# Patient Record
Sex: Male | Born: 1960 | Race: White | Hispanic: No | Marital: Married | Smoking: Former smoker
Health system: Southern US, Community
[De-identification: ages and names within clinical notes are randomized; demographics above are authoritative.]

## PROBLEM LIST (undated history)

## (undated) DIAGNOSIS — M1990 Unspecified osteoarthritis, unspecified site: Secondary | ICD-10-CM

## (undated) DIAGNOSIS — B019 Varicella without complication: Secondary | ICD-10-CM

## (undated) DIAGNOSIS — Z87442 Personal history of urinary calculi: Secondary | ICD-10-CM

## (undated) DIAGNOSIS — K5792 Diverticulitis of intestine, part unspecified, without perforation or abscess without bleeding: Secondary | ICD-10-CM

## (undated) DIAGNOSIS — E785 Hyperlipidemia, unspecified: Secondary | ICD-10-CM

## (undated) DIAGNOSIS — K219 Gastro-esophageal reflux disease without esophagitis: Secondary | ICD-10-CM

## (undated) DIAGNOSIS — I10 Essential (primary) hypertension: Secondary | ICD-10-CM

## (undated) DIAGNOSIS — E119 Type 2 diabetes mellitus without complications: Secondary | ICD-10-CM

## (undated) DIAGNOSIS — T884XXA Failed or difficult intubation, initial encounter: Secondary | ICD-10-CM

## (undated) HISTORY — DX: Varicella without complication: B01.9

## (undated) HISTORY — DX: Hyperlipidemia, unspecified: E78.5

## (undated) HISTORY — DX: Essential (primary) hypertension: I10

## (undated) HISTORY — DX: Diverticulitis of intestine, part unspecified, without perforation or abscess without bleeding: K57.92

## (undated) HISTORY — DX: Type 2 diabetes mellitus without complications: E11.9

## (undated) HISTORY — DX: Gastro-esophageal reflux disease without esophagitis: K21.9

---

## 1992-11-21 HISTORY — PX: KIDNEY STONE SURGERY: SHX686

## 2012-05-25 ENCOUNTER — Ambulatory Visit (INDEPENDENT_AMBULATORY_CARE_PROVIDER_SITE_OTHER): Payer: Medicare Other | Admitting: Internal Medicine

## 2012-05-25 ENCOUNTER — Encounter: Payer: Self-pay | Admitting: Internal Medicine

## 2012-05-25 VITALS — BP 130/80 | HR 64 | Temp 98.7°F | Ht 66.0 in | Wt 209.8 lb

## 2012-05-25 DIAGNOSIS — E119 Type 2 diabetes mellitus without complications: Secondary | ICD-10-CM

## 2012-05-25 DIAGNOSIS — R5383 Other fatigue: Secondary | ICD-10-CM

## 2012-05-25 DIAGNOSIS — E669 Obesity, unspecified: Secondary | ICD-10-CM

## 2012-05-25 DIAGNOSIS — I10 Essential (primary) hypertension: Secondary | ICD-10-CM

## 2012-05-25 DIAGNOSIS — E785 Hyperlipidemia, unspecified: Secondary | ICD-10-CM | POA: Insufficient documentation

## 2012-05-25 MED ORDER — METFORMIN HCL 500 MG PO TABS: 500.0000 mg | ORAL_TABLET | Freq: Two times a day (BID) | ORAL | Status: DC

## 2012-05-25 NOTE — Assessment & Plan Note (Signed)
Recent labs from pt employer show A1c of 6.7%, consistent with DM.  Will restart metformin. Encouraged reduction of intake of processed carbohydrates in the diet. Encouraged moderate physical activity with goal of most days of week. Gave pt glucometer today. Will have him check BG 1-2 times per week and email with readings. Follow up 2 months.

## 2012-05-25 NOTE — Assessment & Plan Note (Signed)
Will check LFTs and labs prior to visit in 2 months. Pt was recently started on Simvastatin by previous PCP.

## 2012-05-25 NOTE — Assessment & Plan Note (Signed)
BMI 33. Encouraged improved diet with reduction in processed carbohydrates and increased physical activity such as walking on treadmill at work, with goal of per week.

## 2012-05-25 NOTE — Assessment & Plan Note (Signed)
BP well controlled today. Will continue current medications. Will check renal function with labs prior to next visit 2 months.

## 2012-05-25 NOTE — Progress Notes (Signed)
Subjective:    Patient ID: Caleb Warren, male    DOB: Sep 05, 1961, 51 y.o.   MRN: [id number]  HPI 51YO male with h/o DM, HTN, HL presents to establish care. His primary concern today is fatigue. 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 He typically gets 5-6 hours of sleep per night.  He has not made any recent changes in diet or exercise routine.  He has a h/o DM, but has recently been off metformin because blood sugars had been well controlled without medication.  He does not check his BG.  He reports full compliance with other medications. He denies focal symptoms such as chest pain, headache, fever, change in bowel habits, dyspnea.  Outpatient Encounter Prescriptions as of 05/25/2012  Medication Sig Dispense Refill  . aspirin 81 MG tablet Take 81 mg by mouth daily.      . Cholecalciferol (VITAMIN D3 SUPER STRENGTH) 2000 UNITS TABS Take 1 tablet by mouth daily.      . ciprofloxacin (CIPRO) 500 MG tablet Take 500 mg by mouth 2 (two) times daily.      Marland Kitchen lisinopril (PRINIVIL,ZESTRIL) 20 MG tablet Take 20 mg by mouth daily.      . metroNIDAZOLE (FLAGYL) 250 MG tablet Take 250 mg by mouth as needed.      Marland Kitchen omeprazole (PRILOSEC) 20 MG capsule Take 20 mg by mouth as needed.      . simvastatin (ZOCOR) 20 MG tablet Take 20 mg by mouth at bedtime.      . metFORMIN (GLUCOPHAGE) 500 MG tablet Take 1 tablet (500 mg total) by mouth 2 (two) times daily with a meal.  180 tablet  3    Review of Systems  Constitutional: Positive for fatigue. Negative for fever, chills, activity change, appetite change and unexpected weight change.  Eyes: Negative for visual disturbance.  Respiratory: Negative for cough and shortness of breath.   Cardiovascular: Negative for chest pain, palpitations and leg swelling.  Gastrointestinal: Negative for abdominal pain and abdominal distention.  Genitourinary: Negative for dysuria, urgency and difficulty urinating.  Musculoskeletal:  Negative for arthralgias and gait problem.  Skin: Negative for color change and rash.  Hematological: Negative for adenopathy.  Psychiatric/Behavioral: Negative for disturbed wake/sleep cycle and dysphoric mood. The patient is not nervous/anxious.    BP 130/80  Pulse 64  Temp 98.7 F (37.1 C) (Oral)  Ht 5\' 6"  (1.676 m)  Wt 209 lb 12 oz (95.142 kg)  BMI 33.85 kg/m2  SpO2 98%      Objective:   Physical Exam  Constitutional: He is oriented to person, place, and time. He appears well-developed and well-nourished. No distress.  HENT:  Head: Normocephalic and atraumatic.  Right Ear: External ear normal.  Left Ear: External ear normal.  Nose: Nose normal.  Mouth/Throat: Oropharynx is clear and moist. No oropharyngeal exudate.  Eyes: Conjunctivae and EOM are normal. Pupils are equal, round, and reactive to light. Right eye exhibits no discharge. Left eye exhibits no discharge. No scleral icterus.  Neck: Normal range of motion. Neck supple. No tracheal deviation present. No thyromegaly present.  Cardiovascular: Normal rate, regular rhythm and normal heart sounds.  Exam reveals no gallop and no friction rub.   No murmur heard. Pulmonary/Chest: Effort normal and breath sounds normal. No respiratory distress. He has no wheezes. He has no rales. He exhibits no tenderness.  Abdominal: Soft. Bowel sounds are normal. He exhibits no distension and no mass. There is no tenderness.  There is no guarding.  Musculoskeletal: Normal range of motion. He exhibits no edema.  Lymphadenopathy:    He has no cervical adenopathy.  Neurological: He is alert and oriented to person, place, and time. No cranial nerve deficit. Coordination normal.  Skin: Skin is warm and dry. No rash noted. He is not diaphoretic. No erythema. No pallor.  Psychiatric: He has a normal mood and affect. His behavior is normal. Judgment and thought content normal.          Assessment & Plan:

## 2012-05-25 NOTE — Assessment & Plan Note (Signed)
Likely related to shift-work schedule and sleep deprivation. Will check CBC, TSH, CMP, B12, and testosterone level with labs. If labs normal, will also consider sleep study. Follow up 2 months.

## 2012-05-31 ENCOUNTER — Telehealth: Payer: Self-pay | Admitting: Internal Medicine

## 2012-05-31 NOTE — Telephone Encounter (Signed)
Advised patient as instructed via message left on cell phone voicemail. 

## 2012-05-31 NOTE — Telephone Encounter (Signed)
Labs from outside show normal blood counts, normal kidney and liver function, normal B12 level, hemoglobin A1c of 7.1%, slightly low testosterone level, normal thyroid function, and normal urine protein. We can discuss at follow up.

## 2012-07-12 ENCOUNTER — Ambulatory Visit (INDEPENDENT_AMBULATORY_CARE_PROVIDER_SITE_OTHER): Payer: No Typology Code available for payment source | Admitting: Internal Medicine

## 2012-07-12 ENCOUNTER — Encounter: Payer: Self-pay | Admitting: Internal Medicine

## 2012-07-12 VITALS — BP 140/90 | HR 60 | Temp 98.3°F | Wt 212.8 lb

## 2012-07-12 DIAGNOSIS — E669 Obesity, unspecified: Secondary | ICD-10-CM

## 2012-07-12 DIAGNOSIS — R5383 Other fatigue: Secondary | ICD-10-CM

## 2012-07-12 DIAGNOSIS — R7989 Other specified abnormal findings of blood chemistry: Secondary | ICD-10-CM | POA: Insufficient documentation

## 2012-07-12 DIAGNOSIS — E785 Hyperlipidemia, unspecified: Secondary | ICD-10-CM

## 2012-07-12 DIAGNOSIS — E291 Testicular hypofunction: Secondary | ICD-10-CM

## 2012-07-12 DIAGNOSIS — I10 Essential (primary) hypertension: Secondary | ICD-10-CM

## 2012-07-12 DIAGNOSIS — E119 Type 2 diabetes mellitus without complications: Secondary | ICD-10-CM

## 2012-07-12 DIAGNOSIS — L989 Disorder of the skin and subcutaneous tissue, unspecified: Secondary | ICD-10-CM

## 2012-07-12 DIAGNOSIS — R5381 Other malaise: Secondary | ICD-10-CM

## 2012-07-12 NOTE — Assessment & Plan Note (Signed)
Blood sugars have been well-controlled. We'll continue metformin. Plan to repeat A1c in October 2013.

## 2012-07-12 NOTE — Assessment & Plan Note (Signed)
Skin lesion of the left temple is most consistent with seborrheic keratosis. Discussed referral to dermatology. He would like to discuss with his girlfriend about 3 would prefer to see. He will call our office with doctor's name.

## 2012-07-12 NOTE — Assessment & Plan Note (Signed)
Lipids well-controlled. Will repeat lipids in October 2013. Continue simvastatin.

## 2012-07-12 NOTE — Assessment & Plan Note (Addendum)
Blood pressure slightly elevated today. Patient will check blood pressure at home 1 or 2 times per week and call or e-mail with results. Will repeat renal function with labs in October 2013. Continue lisinopril.

## 2012-07-12 NOTE — Patient Instructions (Signed)
[facility] 

## 2012-07-12 NOTE — Assessment & Plan Note (Signed)
Patient is starting new exercise and diet program. Encouraged him to limit intake of processed carbohydrates and increasing intake of being protein and vegetables. We also discussed eating small meals 6 times per day. Encouraged him to proceed with exercise program and set goal of aerobic exercise 175 minutes per week.

## 2012-07-12 NOTE — Assessment & Plan Note (Signed)
Patient with some persistent symptoms of fatigue. Questions if this may be related to low testosterone. We discussed potentially replacing testosterone. He would like to hold off and try starting new nutrition and exercise regimen first. If symptoms of fatigue persist, will plan to start testosterone supplementation at next visit in October 2013.

## 2012-07-12 NOTE — Progress Notes (Signed)
Subjective:    Patient ID: Caleb Warren, male    DOB: 1960-12-02, 51 y.o.   MRN: [id number]  HPI 51 year old male with history of diabetes, hypertension, hyperlipidemia presents for followup. He reports he is generally feeling well. In regards to his blood sugars, he reports blood sugar has been well-controlled, typically 110s to 120s. He reports full compliance with metformin. He denies any elevated blood sugars greater than 250. He reports he is focusing on improving his diet and limiting carbohydrates. He plans to adopt a diet of small meals, eating 6 times per day filled with clean protein and vegetables. He is also planning to start an exercise program with increased cardio.  In regards to hypertension, he reports he has not been regularly checking his blood pressure at home but has not had any headache, palpitations, or chest pain. He reports full compliance with his medication.  He continues to have some mild fatigue. He questions if this may be secondary to low testosterone. He is interested in looking into testosterone supplementation.  Outpatient Encounter Prescriptions as of 07/12/2012  Medication Sig Dispense Refill  . aspirin 81 MG tablet Take 81 mg by mouth daily.      . Cholecalciferol (VITAMIN D3 SUPER STRENGTH) 2000 UNITS TABS Take 1 tablet by mouth daily.      Marland Kitchen lisinopril (PRINIVIL,ZESTRIL) 20 MG tablet Take 20 mg by mouth daily.      . metFORMIN (GLUCOPHAGE) 500 MG tablet Take 1 tablet (500 mg total) by mouth 2 (two) times daily with a meal.  180 tablet  3  . metroNIDAZOLE (FLAGYL) 250 MG tablet Take 250 mg by mouth as needed.      Marland Kitchen omeprazole (PRILOSEC) 20 MG capsule Take 20 mg by mouth as needed.      . simvastatin (ZOCOR) 20 MG tablet Take 20 mg by mouth at bedtime.      Marland Kitchen DISCONTD: ciprofloxacin (CIPRO) 500 MG tablet Take 500 mg by mouth 2 (two) times daily.        Review of Systems  Constitutional: Positive for fatigue. Negative for fever, chills, activity change,  appetite change and unexpected weight change.  Eyes: Negative for visual disturbance.  Respiratory: Negative for cough and shortness of breath.   Cardiovascular: Negative for chest pain, palpitations and leg swelling.  Gastrointestinal: Negative for abdominal pain and abdominal distention.  Genitourinary: Negative for dysuria, urgency and difficulty urinating.  Musculoskeletal: Negative for arthralgias and gait problem.  Skin: Negative for color change and rash.  Hematological: Negative for adenopathy.  Psychiatric/Behavioral: Negative for disturbed wake/sleep cycle and dysphoric mood. The patient is not nervous/anxious.    BP 140/90  Pulse 60  Temp 98.3 F (36.8 C) (Oral)  Wt 212 lb 12 oz (96.503 kg)  SpO2 98%     Objective:   Physical Exam  Constitutional: He is oriented to person, place, and time. He appears well-developed and well-nourished. No distress.  HENT:  Head: Normocephalic and atraumatic.  Right Ear: External ear normal.  Left Ear: External ear normal.  Nose: Nose normal.  Mouth/Throat: Oropharynx is clear and moist. No oropharyngeal exudate.  Eyes: Conjunctivae and EOM are normal. Pupils are equal, round, and reactive to light. Right eye exhibits no discharge. Left eye exhibits no discharge. No scleral icterus.  Neck: Normal range of motion. Neck supple. No tracheal deviation present. No thyromegaly present.  Cardiovascular: Normal rate, regular rhythm and normal heart sounds.  Exam reveals no gallop and no friction rub.   No murmur heard.  Pulmonary/Chest: Effort normal and breath sounds normal. No respiratory distress. He has no wheezes. He has no rales. He exhibits no tenderness.  Musculoskeletal: Normal range of motion. He exhibits no edema.  Lymphadenopathy:    He has no cervical adenopathy.  Neurological: He is alert and oriented to person, place, and time. No cranial nerve deficit. Coordination normal.  Skin: Skin is warm and dry. No rash noted. He is not  diaphoretic. No erythema. No pallor.  Psychiatric: He has a normal mood and affect. His behavior is normal. Judgment and thought content normal.          Assessment & Plan:

## 2012-07-12 NOTE — Assessment & Plan Note (Signed)
Total testosterone was slightly low on labs from July. We discussed checking free and total testosterone. We also discussed potential risk and benefits of testosterone supplementation. He would prefer to hold off for now. Will repeat testosterone level in October 2013.

## 2012-10-22 ENCOUNTER — Ambulatory Visit: Payer: No Typology Code available for payment source | Admitting: Internal Medicine

## 2012-10-23 ENCOUNTER — Ambulatory Visit (INDEPENDENT_AMBULATORY_CARE_PROVIDER_SITE_OTHER): Payer: No Typology Code available for payment source | Admitting: Internal Medicine

## 2012-10-23 ENCOUNTER — Encounter: Payer: Self-pay | Admitting: Internal Medicine

## 2012-10-23 VITALS — BP 132/80 | HR 82 | Temp 97.8°F | Resp 16 | Wt 208.5 lb

## 2012-10-23 DIAGNOSIS — I10 Essential (primary) hypertension: Secondary | ICD-10-CM

## 2012-10-23 DIAGNOSIS — E669 Obesity, unspecified: Secondary | ICD-10-CM

## 2012-10-23 DIAGNOSIS — E119 Type 2 diabetes mellitus without complications: Secondary | ICD-10-CM

## 2012-10-23 DIAGNOSIS — Z23 Encounter for immunization: Secondary | ICD-10-CM

## 2012-10-23 DIAGNOSIS — E785 Hyperlipidemia, unspecified: Secondary | ICD-10-CM

## 2012-10-23 MED ORDER — PNEUMOCOCCAL VAC POLYVALENT 25 MCG/0.5ML IJ INJ: 0.5000 mL | INJECTION | Freq: Once | INTRAMUSCULAR | Status: DC

## 2012-10-23 NOTE — Assessment & Plan Note (Signed)
BG well controlled based on pt report. Will check A1c with labs today. Continue metformin. Follow up 3 months and prn.

## 2012-10-23 NOTE — Assessment & Plan Note (Signed)
BP well controlled on current meds. Will continue. Will check renal function with labs. Follow up 3 months and prn.

## 2012-10-23 NOTE — Assessment & Plan Note (Signed)
Will check lipids with labs. Continue Simvastatin. Follow up 3 months.

## 2012-10-23 NOTE — Assessment & Plan Note (Signed)
BMI 33. Encouraged increasing intake of lean protein and decreased intake of processed carbohydrates. Encouraged goal of 30-95min physical exercise 5 days per week.

## 2012-10-23 NOTE — Progress Notes (Signed)
Subjective:    Patient ID: Caleb Warren, male    DOB: March 01, 1961, 51 y.o.   MRN: [id number]  HPI 51YO male with h/o DM, HTN, HL presents for follow up. Doing well. BG typically near 100-120 fasting.  Compliant with metformin. In regards to BP, denies any chest pain, palpitations, headache. Compliant with lisinopril. No new concerns today. Normal energy level. Has recently been traveling and spending time snorkeling.  Outpatient Encounter Prescriptions as of 10/23/2012  Medication Sig Dispense Refill  . aspirin 81 MG tablet Take 81 mg by mouth daily.      . Cholecalciferol (VITAMIN D3 SUPER STRENGTH) 2000 UNITS TABS Take 1 tablet by mouth daily.      Marland Kitchen lisinopril (PRINIVIL,ZESTRIL) 20 MG tablet Take 20 mg by mouth daily.      . metFORMIN (GLUCOPHAGE) 500 MG tablet Take 1 tablet (500 mg total) by mouth 2 (two) times daily with a meal.  180 tablet  3  . metroNIDAZOLE (FLAGYL) 250 MG tablet Take 250 mg by mouth as needed.      Marland Kitchen omeprazole (PRILOSEC) 20 MG capsule Take 20 mg by mouth as needed.      . simvastatin (ZOCOR) 20 MG tablet Take 20 mg by mouth at bedtime.       Facility-Administered Encounter Medications as of 10/23/2012  Medication Dose Route Frequency Provider Last Rate Last Dose  . pneumococcal 23 valent vaccine (PNU-IMMUNE) injection 0.5 mL  0.5 mL Intramuscular Once Shelia Media, MD       BP 132/80  Pulse 82  Temp 97.8 F (36.6 C) (Oral)  Resp 16  Wt 208 lb 8 oz (94.575 kg)  Review of Systems  Constitutional: Negative for fever, chills, activity change, appetite change, fatigue and unexpected weight change.  Eyes: Negative for visual disturbance.  Respiratory: Negative for cough and shortness of breath.   Cardiovascular: Negative for chest pain, palpitations and leg swelling.  Gastrointestinal: Negative for abdominal pain and abdominal distention.  Genitourinary: Negative for dysuria, urgency and difficulty urinating.  Musculoskeletal: Negative for arthralgias and gait  problem.  Skin: Negative for color change and rash.  Hematological: Negative for adenopathy.  Psychiatric/Behavioral: Negative for sleep disturbance and dysphoric mood. The patient is not nervous/anxious.        Objective:   Physical Exam  Constitutional: He is oriented to person, place, and time. He appears well-developed and well-nourished. No distress.  HENT:  Head: Normocephalic and atraumatic.  Right Ear: External ear normal.  Left Ear: External ear normal.  Nose: Nose normal.  Mouth/Throat: Oropharynx is clear and moist. No oropharyngeal exudate.  Eyes: Conjunctivae normal and EOM are normal. Pupils are equal, round, and reactive to light. Right eye exhibits no discharge. Left eye exhibits no discharge. No scleral icterus.  Neck: Normal range of motion. Neck supple. No tracheal deviation present. No thyromegaly present.  Cardiovascular: Normal rate, regular rhythm and normal heart sounds.  Exam reveals no gallop and no friction rub.   No murmur heard. Pulmonary/Chest: Effort normal and breath sounds normal. No respiratory distress. He has no wheezes. He has no rales. He exhibits no tenderness.  Musculoskeletal: Normal range of motion. He exhibits no edema.  Lymphadenopathy:    He has no cervical adenopathy.  Neurological: He is alert and oriented to person, place, and time. No cranial nerve deficit. Coordination normal.  Skin: Skin is warm and dry. No rash noted. He is not diaphoretic. No erythema. No pallor.  Psychiatric: He has a normal mood and affect.  His behavior is normal. Judgment and thought content normal.          Assessment & Plan:

## 2013-02-05 ENCOUNTER — Ambulatory Visit: Payer: No Typology Code available for payment source | Admitting: Internal Medicine

## 2013-03-07 ENCOUNTER — Ambulatory Visit (INDEPENDENT_AMBULATORY_CARE_PROVIDER_SITE_OTHER): Payer: No Typology Code available for payment source | Admitting: Internal Medicine

## 2013-03-07 ENCOUNTER — Encounter: Payer: Self-pay | Admitting: Internal Medicine

## 2013-03-07 VITALS — BP 120/90 | HR 84 | Temp 97.9°F | Wt 209.0 lb

## 2013-03-07 DIAGNOSIS — G4726 Circadian rhythm sleep disorder, shift work type: Secondary | ICD-10-CM

## 2013-03-07 DIAGNOSIS — R5381 Other malaise: Secondary | ICD-10-CM

## 2013-03-07 DIAGNOSIS — E119 Type 2 diabetes mellitus without complications: Secondary | ICD-10-CM

## 2013-03-07 DIAGNOSIS — E785 Hyperlipidemia, unspecified: Secondary | ICD-10-CM

## 2013-03-07 DIAGNOSIS — I10 Essential (primary) hypertension: Secondary | ICD-10-CM

## 2013-03-07 MED ORDER — ARMODAFINIL 150 MG PO TABS: 150.0000 mg | ORAL_TABLET | Freq: Every day | ORAL | Status: DC

## 2013-03-07 NOTE — Assessment & Plan Note (Signed)
Patient with persistent generalized fatigue. Labs are unremarkable in the past except for mildly low testosterone. Typically sleeping 4 to 6 hours per night. Has never been evaluated for sleep apnea. Will set up sleep study. Suspect that shift work is playing a significant role and ongoing fatigue. 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

## 2013-03-07 NOTE — Assessment & Plan Note (Signed)
Per patient report, blood sugars have been well-controlled at home. Will check A1c with labs. Continue current medications.

## 2013-03-07 NOTE — Assessment & Plan Note (Signed)
BP Readings from Last 3 Encounters:  03/07/13 120/90  10/23/12 132/80  07/12/12 140/90   Blood pressures well-controlled on current medications. Will check renal function with labs.

## 2013-03-07 NOTE — Progress Notes (Signed)
Subjective:    Patient ID: Caleb Warren, male    DOB: 1961-08-05, 52 y.o.   MRN: [id number]  HPI 52 year old male with history of diabetes, hypertension, hyperlipidemia presents for followup. He reports he is generally doing well. He reports blood sugars have been well-controlled. He is compliant with his medications. He continues to have ongoing significant fatigue. He reports that when at work, he can fall asleep after sitting still for only a few minutes. 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 He finds it difficult to transition. He is unsure he snores at night. He typically sleeps for about 4-6 hours each night. He has never been evaluated for sleep apnea. He denies any focal symptoms of shortness of breath or chest pain. He denies change in bowel habits or appetite.  Outpatient Encounter Prescriptions as of 03/07/2013  Medication Sig Dispense Refill  . aspirin 81 MG tablet Take 81 mg by mouth daily.      . Cholecalciferol (VITAMIN D3 SUPER STRENGTH) 2000 UNITS TABS Take 1 tablet by mouth daily.      Marland Kitchen lisinopril (PRINIVIL,ZESTRIL) 20 MG tablet Take 20 mg by mouth daily.      . metFORMIN (GLUCOPHAGE) 500 MG tablet Take 1 tablet (500 mg total) by mouth 2 (two) times daily with a meal.  180 tablet  3  . metroNIDAZOLE (FLAGYL) 250 MG tablet Take 250 mg by mouth as needed.      Marland Kitchen omeprazole (PRILOSEC) 20 MG capsule Take 20 mg by mouth as needed.      . simvastatin (ZOCOR) 20 MG tablet Take 20 mg by mouth at bedtime.      . Armodafinil 150 MG tablet Take 1 tablet (150 mg total) by mouth daily.  30 tablet  3  . [DISCONTINUED] pneumococcal 23 valent vaccine (PNU-IMMUNE) injection 0.5 mL        No facility-administered encounter medications on file as of 03/07/2013.   BP 120/90  Pulse 84  Temp(Src) 97.9 F (36.6 C) (Oral)  Wt 209 lb (94.802 kg)  BMI 33.75 kg/m2  SpO2 95%    Review of Systems  Constitutional: Positive for fatigue. Negative for  fever, chills, activity change, appetite change and unexpected weight change.  Eyes: Negative for visual disturbance.  Respiratory: Negative for cough and shortness of breath.   Cardiovascular: Negative for chest pain, palpitations and leg swelling.  Gastrointestinal: Negative for abdominal pain and abdominal distention.  Genitourinary: Negative for dysuria, urgency and difficulty urinating.  Musculoskeletal: Negative for arthralgias and gait problem.  Skin: Negative for color change and rash.  Hematological: Negative for adenopathy.  Psychiatric/Behavioral: Negative for sleep disturbance and dysphoric mood. The patient is not nervous/anxious.        Objective:   Physical Exam  Constitutional: He is oriented to person, place, and time. He appears well-developed and well-nourished. No distress.  HENT:  Head: Normocephalic and atraumatic.  Right Ear: External ear normal.  Left Ear: External ear normal.  Nose: Nose normal.  Mouth/Throat: Oropharynx is clear and moist. No oropharyngeal exudate.  Eyes: Conjunctivae and EOM are normal. Pupils are equal, round, and reactive to light. Right eye exhibits no discharge. Left eye exhibits no discharge. No scleral icterus.  Neck: Normal range of motion. Neck supple. No tracheal deviation present. No thyromegaly present.  Cardiovascular: Normal rate, regular rhythm and normal heart sounds.  Exam reveals no gallop and no friction rub.   No murmur heard. Pulmonary/Chest: Breath sounds  normal. No accessory muscle usage. Not tachypneic. No respiratory distress. He has no decreased breath sounds. He has no wheezes. He has no rales. He exhibits no tenderness.  Musculoskeletal: Normal range of motion. He exhibits no edema.  Lymphadenopathy:    He has no cervical adenopathy.  Neurological: He is alert and oriented to person, place, and time. No cranial nerve deficit. Coordination normal.  Skin: Skin is warm and dry. No rash noted. He is not diaphoretic. No  erythema. No pallor.  Psychiatric: He has a normal mood and affect. His behavior is normal. Judgment and thought content normal.          Assessment & Plan:

## 2013-03-07 NOTE — Assessment & Plan Note (Signed)
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 Will start Nuvigil. He will take medication 1 hour prior to start of his work shift. Pt will follow up in 4 weeks and prn.

## 2013-03-07 NOTE — Assessment & Plan Note (Signed)
Will check lipids and LFTs with labs. 

## 2013-03-20 ENCOUNTER — Telehealth: Payer: Self-pay | Admitting: Internal Medicine

## 2013-03-20 NOTE — Telephone Encounter (Signed)
Labs reviewed from 4/25 look good. Triglycerides slightly high, 301. Would recommend effort at limiting processed carbohydrate intake and increasing physical activity with goal 3x per week.

## 2013-03-22 ENCOUNTER — Telehealth: Payer: Self-pay

## 2013-03-22 NOTE — Telephone Encounter (Signed)
MyChart Message: Labs reviewed from 4/25 look good. Triglycerides slightly high, 301. Would recommend effort at limiting processed carbohydrate intake and increasing physical activity with goal 3x per week.   Notified patient of his lab results.

## 2013-03-23 ENCOUNTER — Encounter: Payer: Self-pay | Admitting: Internal Medicine

## 2013-04-03 ENCOUNTER — Ambulatory Visit: Payer: Self-pay | Admitting: Internal Medicine

## 2013-04-05 ENCOUNTER — Ambulatory Visit: Payer: No Typology Code available for payment source | Admitting: Internal Medicine

## 2013-04-23 ENCOUNTER — Encounter: Payer: Self-pay | Admitting: Internal Medicine

## 2013-04-23 ENCOUNTER — Ambulatory Visit (INDEPENDENT_AMBULATORY_CARE_PROVIDER_SITE_OTHER): Payer: No Typology Code available for payment source | Admitting: Internal Medicine

## 2013-04-23 VITALS — BP 120/90 | HR 73 | Temp 98.2°F | Wt 204.0 lb

## 2013-04-23 DIAGNOSIS — G4726 Circadian rhythm sleep disorder, shift work type: Secondary | ICD-10-CM

## 2013-04-23 MED ORDER — ARMODAFINIL 250 MG PO TABS: 250.0000 mg | ORAL_TABLET | Freq: Every day | ORAL | Status: DC

## 2013-04-23 NOTE — Assessment & Plan Note (Signed)
Symptoms improved with Nuvigil, however effects are short-lived and do not last through shift. Will try increasing dose to 250mg . Follow up 8 weeks and prn.

## 2013-04-23 NOTE — Progress Notes (Signed)
Subjective:    Patient ID: Caleb Warren, male    DOB: 1961/04/28, 52 y.o.   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 Caleb Warren reports that symptoms are much improved on this medication. Caleb Warren denies any side effects from the medicine. However, fracture of the medicine seemed to be short lived, only a couple of hours. Caleb Warren questions whether higher dose might be helpful. Caleb Warren has not had any difficulty sleeping well using this medication.  Outpatient Encounter Prescriptions as of 04/23/2013  Medication Sig Dispense Refill  . aspirin 81 MG tablet Take 81 mg by mouth daily.      . Cholecalciferol (VITAMIN D3 SUPER STRENGTH) 2000 UNITS TABS Take 1 tablet by mouth daily.      Marland Kitchen lisinopril (PRINIVIL,ZESTRIL) 20 MG tablet Take 20 mg by mouth daily.      . Magnesium 100 MG CAPS Take by mouth.      . metFORMIN (GLUCOPHAGE) 500 MG tablet Take 1 tablet (500 mg total) by mouth 2 (two) times daily with a meal.  180 tablet  3  . omeprazole (PRILOSEC) 20 MG capsule Take 20 mg by mouth as needed.      . simvastatin (ZOCOR) 20 MG tablet Take 20 mg by mouth at bedtime.      . [DISCONTINUED] Armodafinil 150 MG tablet Take 1 tablet (150 mg total) by mouth daily.  30 tablet  3  . Armodafinil 250 MG tablet Take 1 tablet (250 mg total) by mouth daily.  30 tablet  3  . metroNIDAZOLE (FLAGYL) 250 MG tablet Take 250 mg by mouth as needed.       No facility-administered encounter medications on file as of 04/23/2013.   BP 120/90  Pulse 73  Temp(Src) 98.2 F (36.8 C) (Oral)  Wt 204 lb (92.534 kg)  BMI 32.94 kg/m2  SpO2 95%  Review of Systems  Constitutional: Negative for fever, chills, activity change, appetite change, fatigue and unexpected weight change.  Eyes: Negative for visual disturbance.  Respiratory: Negative for cough and shortness of breath.   Cardiovascular: Negative for chest pain, palpitations and leg swelling.  Gastrointestinal: Negative for  abdominal pain and abdominal distention.  Genitourinary: Negative for dysuria, urgency and difficulty urinating.  Musculoskeletal: Negative for arthralgias and gait problem.  Skin: Negative for color change and rash.  Hematological: Negative for adenopathy.  Psychiatric/Behavioral: Negative for sleep disturbance and dysphoric mood. The patient is not nervous/anxious.        Objective:   Physical Exam  Constitutional: Caleb Warren is oriented to person, place, and time. Caleb Warren appears well-developed and well-nourished. No distress.  HENT:  Head: Normocephalic and atraumatic.  Right Ear: External ear normal.  Left Ear: External ear normal.  Nose: Nose normal.  Mouth/Throat: Oropharynx is clear and moist. No oropharyngeal exudate.  Eyes: Conjunctivae and EOM are normal. Pupils are equal, round, and reactive to light. Right eye exhibits no discharge. Left eye exhibits no discharge. No scleral icterus.  Neck: Normal range of motion. Neck supple. No tracheal deviation present. No thyromegaly present.  Cardiovascular: Normal rate, regular rhythm and normal heart sounds.  Exam reveals no gallop and no friction rub.   No murmur heard. Pulmonary/Chest: Effort normal and breath sounds normal. No respiratory distress. Caleb Warren has no wheezes. Caleb Warren has no rales. Caleb Warren exhibits no tenderness.  Musculoskeletal: Normal range of motion. Caleb Warren exhibits no edema.  Lymphadenopathy:    Caleb Warren has no cervical adenopathy.  Neurological: Caleb Warren is alert  and oriented to person, place, and time. No cranial nerve deficit. Coordination normal.  Skin: Skin is warm and dry. No rash noted. Caleb Warren is not diaphoretic. No erythema. No pallor.  Psychiatric: Caleb Warren has a normal mood and affect. His behavior is normal. Judgment and thought content normal.          Assessment & Plan:

## 2013-05-22 ENCOUNTER — Telehealth: Payer: Self-pay | Admitting: Internal Medicine

## 2013-05-22 NOTE — Telephone Encounter (Signed)
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 They recommend CPAP titration study. Has this been scheduled?

## 2013-05-23 NOTE — Telephone Encounter (Signed)
Left message to call back  

## 2013-05-27 ENCOUNTER — Other Ambulatory Visit: Payer: Self-pay | Admitting: Internal Medicine

## 2013-05-27 NOTE — Telephone Encounter (Signed)
Left message to call back  

## 2013-05-28 NOTE — Telephone Encounter (Signed)
Patient never returned call, letter mailed to home address on file.  

## 2013-05-29 ENCOUNTER — Telehealth: Payer: Self-pay | Admitting: Internal Medicine

## 2013-05-29 NOTE — Telephone Encounter (Signed)
FYI to Dr. Walker 

## 2013-05-29 NOTE — Telephone Encounter (Signed)
Hold off on the CPAP titration . The patient stated he is still being billed from the sleep study and he wants to wait.

## 2013-06-18 ENCOUNTER — Encounter: Payer: Self-pay | Admitting: Internal Medicine

## 2013-06-19 ENCOUNTER — Encounter: Payer: Self-pay | Admitting: Internal Medicine

## 2013-06-20 ENCOUNTER — Encounter: Payer: Self-pay | Admitting: Internal Medicine

## 2013-06-20 MED ORDER — MODAFINIL 200 MG PO TABS: 200.0000 mg | ORAL_TABLET | Freq: Every day | ORAL | Status: DC

## 2013-06-21 ENCOUNTER — Ambulatory Visit: Payer: No Typology Code available for payment source | Admitting: Internal Medicine

## 2013-07-12 ENCOUNTER — Telehealth: Payer: Self-pay | Admitting: Internal Medicine

## 2013-07-12 NOTE — Telephone Encounter (Signed)
Labs were relatively stable. A1c was 6.5%. We can review labs in detail at next visit.

## 2013-07-12 NOTE — Telephone Encounter (Signed)
Patient informed and verbally agreed.  

## 2013-07-20 LAB — HM DIABETES EYE EXAM

## 2013-07-23 ENCOUNTER — Encounter: Payer: Self-pay | Admitting: *Deleted

## 2013-07-24 ENCOUNTER — Ambulatory Visit (INDEPENDENT_AMBULATORY_CARE_PROVIDER_SITE_OTHER): Payer: No Typology Code available for payment source | Admitting: Internal Medicine

## 2013-07-24 ENCOUNTER — Encounter: Payer: Self-pay | Admitting: Internal Medicine

## 2013-07-24 VITALS — BP 122/86 | HR 72 | Temp 98.3°F | Ht 65.0 in | Wt 204.0 lb

## 2013-07-24 DIAGNOSIS — G4726 Circadian rhythm sleep disorder, shift work type: Secondary | ICD-10-CM

## 2013-07-24 DIAGNOSIS — E785 Hyperlipidemia, unspecified: Secondary | ICD-10-CM

## 2013-07-24 DIAGNOSIS — E669 Obesity, unspecified: Secondary | ICD-10-CM

## 2013-07-24 DIAGNOSIS — I10 Essential (primary) hypertension: Secondary | ICD-10-CM

## 2013-07-24 DIAGNOSIS — E119 Type 2 diabetes mellitus without complications: Secondary | ICD-10-CM

## 2013-07-24 DIAGNOSIS — R6882 Decreased libido: Secondary | ICD-10-CM

## 2013-07-24 MED ORDER — LISINOPRIL 20 MG PO TABS: 20.0000 mg | ORAL_TABLET | Freq: Every day | ORAL | Status: DC

## 2013-07-24 MED ORDER — METFORMIN HCL 500 MG PO TABS: ORAL_TABLET | ORAL | Status: DC

## 2013-07-24 MED ORDER — OMEPRAZOLE 20 MG PO CPDR: 20.0000 mg | DELAYED_RELEASE_CAPSULE | ORAL | Status: DC | PRN

## 2013-07-24 MED ORDER — PHENTERMINE HCL 37.5 MG PO CAPS: 37.5000 mg | ORAL_CAPSULE | ORAL | Status: DC

## 2013-07-24 MED ORDER — SILDENAFIL CITRATE 50 MG PO TABS: 50.0000 mg | ORAL_TABLET | Freq: Every day | ORAL | Status: DC | PRN

## 2013-07-24 MED ORDER — SIMVASTATIN 20 MG PO TABS: 20.0000 mg | ORAL_TABLET | Freq: Every day | ORAL | Status: DC

## 2013-07-24 NOTE — Assessment & Plan Note (Signed)
BP Readings from Last 3 Encounters:  07/24/13 122/86  04/23/13 120/90  03/07/13 120/90   Blood pressure generally well controlled with lisinopril. Will continue.

## 2013-07-24 NOTE — Assessment & Plan Note (Signed)
Wt Readings from Last 3 Encounters:  07/24/13 204 lb (92.534 kg)  04/23/13 204 lb (92.534 kg)  03/07/13 209 lb (94.802 kg)   Encouraged continued efforts at healthy diet and regular physical activity with goal of weight loss. Will start phentermine to help with appetite suppression. Discussed potential risks and side effects of this medication. Pt will monitor BP daily. Follow up in 4 weeks and prn.

## 2013-07-24 NOTE — Assessment & Plan Note (Signed)
Patient has done very well with use of Nuvigil, however his insurance will not cover Nuvigil or Provigil. 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 Given that we are starting phentermine to help with appetite, and this medication is a stimulant, will monitor symptoms during his shifts to see how tolerated. Discussed potential referral to sleep specialist if symptoms persistent.

## 2013-07-24 NOTE — Assessment & Plan Note (Signed)
Excellent control of blood sugars with A1c 6.5%. Encouraged continued efforts at healthy diet, regular physical activity with goal of weight loss. Continue metformin.

## 2013-07-24 NOTE — Progress Notes (Signed)
Subjective:    Patient ID: Caleb Warren, male    DOB: September 18, 1961, 52 y.o.   MRN: [id number]  HPI 52 year old male with history of hypertension, diabetes, obesity, hyperlipidemia, shift work sleep disorder presents for followup.  In regards to diabetes, blood sugars have been under excellent control with most fasting blood sugars below 100. Recent A1c was 6.5%. He is compliant with metformin. He has adopted a healthier diet and is trying to lose weight.  In regards to shift work sleep disorder, his insurance will no longer cover Nuvigil or Provigil. 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 However the medication is over 1000 dollars per month. He would like to consider other options.  He notes some recent decrease in libido. He questions whether this may be attributed to use of Lisinopril. He denies any issues with sexual function.  Outpatient Prescriptions Prior to Visit  Medication Sig Dispense Refill  . aspirin 81 MG tablet Take 81 mg by mouth daily.      . Cholecalciferol (VITAMIN D3 SUPER STRENGTH) 2000 UNITS TABS Take 1 tablet by mouth daily.      . Magnesium 100 MG CAPS Take by mouth.      . Armodafinil 250 MG tablet Take 1 tablet (250 mg total) by mouth daily.  30 tablet  3  . lisinopril (PRINIVIL,ZESTRIL) 20 MG tablet Take 20 mg by mouth daily.      . metFORMIN (GLUCOPHAGE) 500 MG tablet TAKE 1 TABLET (500 MG TOTAL) BY MOUTH 2 (TWO) TIMES DAILY WITH A MEAL.  180 tablet  1  . modafinil (PROVIGIL) 200 MG tablet Take 1 tablet (200 mg total) by mouth daily.  30 tablet  3  . omeprazole (PRILOSEC) 20 MG capsule Take 20 mg by mouth as needed.      . simvastatin (ZOCOR) 20 MG tablet Take 20 mg by mouth at bedtime.      . metroNIDAZOLE (FLAGYL) 250 MG tablet Take 250 mg by mouth as needed.       No facility-administered medications prior to visit.   BP 122/86  Pulse 72  Temp(Src) 98.3 F (36.8 C) (Oral)  Ht  5\' 5"  (1.651 m)  Wt 204 lb (92.534 kg)  BMI 33.95 kg/m2  SpO2 96%  Review of Systems  Constitutional: Negative for fever, chills, activity change, appetite change, fatigue and unexpected weight change.  Eyes: Negative for visual disturbance.  Respiratory: Negative for cough and shortness of breath.   Cardiovascular: Negative for chest pain, palpitations and leg swelling.  Gastrointestinal: Negative for abdominal pain and abdominal distention.  Genitourinary: Negative for dysuria, urgency and difficulty urinating.  Musculoskeletal: Negative for arthralgias and gait problem.  Skin: Negative for color change and rash.  Hematological: Negative for adenopathy.  Psychiatric/Behavioral: Negative for sleep disturbance and dysphoric mood. The patient is not nervous/anxious.        Objective:   Physical Exam  Constitutional: He is oriented to person, place, and time. He appears well-developed and well-nourished. No distress.  HENT:  Head: Normocephalic and atraumatic.  Right Ear: External ear normal.  Left Ear: External ear normal.  Nose: Nose normal.  Mouth/Throat: Oropharynx is clear and moist. No oropharyngeal exudate.  Eyes: Conjunctivae and EOM are normal. Pupils are equal, round, and reactive to light. Right eye exhibits no discharge. Left eye exhibits no discharge. No scleral icterus.  Neck: Normal range of motion. Neck supple. No tracheal deviation present. No thyromegaly present.  Cardiovascular: Normal rate, regular rhythm and normal heart sounds.  Exam reveals no gallop and no friction rub.   No murmur heard. Pulmonary/Chest: Effort normal and breath sounds normal. No respiratory distress. He has no wheezes. He has no rales. He exhibits no tenderness.  Musculoskeletal: Normal range of motion. He exhibits no edema.  Lymphadenopathy:    He has no cervical adenopathy.  Neurological: He is alert and oriented to person, place, and time. No cranial nerve deficit. Coordination normal.   Skin: Skin is warm and dry. No rash noted. He is not diaphoretic. No erythema. No pallor.  Psychiatric: He has a normal mood and affect. His behavior is normal. Judgment and thought content normal.          Assessment & Plan:

## 2013-07-24 NOTE — Assessment & Plan Note (Signed)
Reviewed recent lipids which show good control of cholesterol with use of simvastatin. We'll continue.

## 2013-07-24 NOTE — Assessment & Plan Note (Signed)
Suspect related to shift work and job stressors, and likely compounded by low testosterone. Discussed risks of testosterone supplementation and recommended against this for now. Discussed potential referral to urology. Discussed trying prn Viagra to see if any improvement in symptoms if less anxiety about performance. Sample of viagra given today. Follow up 1 month.

## 2013-07-30 ENCOUNTER — Encounter: Payer: Self-pay | Admitting: Internal Medicine

## 2013-08-14 NOTE — Telephone Encounter (Signed)
Mailed unread message to pt  

## 2013-08-21 ENCOUNTER — Encounter: Payer: Self-pay | Admitting: Internal Medicine

## 2013-08-21 ENCOUNTER — Ambulatory Visit (INDEPENDENT_AMBULATORY_CARE_PROVIDER_SITE_OTHER): Payer: No Typology Code available for payment source | Admitting: Internal Medicine

## 2013-08-21 VITALS — BP 110/82 | HR 76 | Temp 98.3°F | Wt 193.0 lb

## 2013-08-21 DIAGNOSIS — E669 Obesity, unspecified: Secondary | ICD-10-CM

## 2013-08-21 DIAGNOSIS — E119 Type 2 diabetes mellitus without complications: Secondary | ICD-10-CM

## 2013-08-21 MED ORDER — PHENTERMINE HCL 37.5 MG PO CAPS: 37.5000 mg | ORAL_CAPSULE | ORAL | Status: DC

## 2013-08-21 NOTE — Assessment & Plan Note (Signed)
Blood sugars well controlled per pt with fasting BG near 80. Encouraged continued healthy diet and regular physical activity. Continue metformin.

## 2013-08-21 NOTE — Progress Notes (Signed)
Subjective:    Patient ID: Caleb Warren, male    DOB: [birth year], 52 y.o.   MRN: [id number]  HPI 52 year old male with history of diabetes and obesity presents for followup. At his last visit, he was started on phentermine to help with appetite suppression. He reports significant improvement in appetite. He is limiting caloric intake. He is also trying to exercise on a regular basis. He reports he is feeling well. Fasting blood sugars have been near 80. No new concerns today.  Outpatient Encounter Prescriptions as of 08/21/2013  Medication Sig Dispense Refill  . aspirin 81 MG tablet Take 81 mg by mouth daily.      . Cholecalciferol (VITAMIN D3 SUPER STRENGTH) 2000 UNITS TABS Take 1 tablet by mouth daily.      Marland Kitchen lisinopril (PRINIVIL,ZESTRIL) 20 MG tablet Take 1 tablet (20 mg total) by mouth daily.  90 tablet  3  . Magnesium 100 MG CAPS Take by mouth.      . metFORMIN (GLUCOPHAGE) 500 MG tablet TAKE 1 TABLET (500 MG TOTAL) BY MOUTH 2 (TWO) TIMES DAILY WITH A MEAL.  180 tablet  3  . omeprazole (PRILOSEC) 20 MG capsule Take 1 capsule (20 mg total) by mouth as needed.  90 capsule  3  . phentermine 37.5 MG capsule Take 1 capsule (37.5 mg total) by mouth every morning.  30 capsule  1  . simvastatin (ZOCOR) 20 MG tablet Take 1 tablet (20 mg total) by mouth at bedtime.  90 tablet  3  . [DISCONTINUED] phentermine 37.5 MG capsule Take 1 capsule (37.5 mg total) by mouth every morning.  30 capsule  1  . sildenafil (VIAGRA) 50 MG tablet Take 1 tablet (50 mg total) by mouth daily as needed for erectile dysfunction.  10 tablet  0   No facility-administered encounter medications on file as of 08/21/2013.   BP 110/82  Pulse 76  Temp(Src) 98.3 F (36.8 C) (Oral)  Wt 193 lb (87.544 kg)  BMI 32.12 kg/m2  SpO2 97%  Review of Systems  Constitutional: Negative for fever, chills, activity change, appetite change, fatigue and unexpected weight change.  Eyes: Negative for visual disturbance.  Respiratory:  Negative for cough and shortness of breath.   Cardiovascular: Negative for chest pain, palpitations and leg swelling.  Gastrointestinal: Negative for abdominal pain and abdominal distention.  Genitourinary: Negative for dysuria, urgency and difficulty urinating.  Musculoskeletal: Negative for arthralgias and gait problem.  Skin: Negative for color change and rash.  Hematological: Negative for adenopathy.  Psychiatric/Behavioral: Negative for sleep disturbance and dysphoric mood. The patient is not nervous/anxious.        Objective:   Physical Exam  Constitutional: He is oriented to person, place, and time. He appears well-developed and well-nourished. No distress.  HENT:  Head: Normocephalic and atraumatic.  Right Ear: External ear normal.  Left Ear: External ear normal.  Nose: Nose normal.  Mouth/Throat: Oropharynx is clear and moist. No oropharyngeal exudate.  Eyes: Conjunctivae and EOM are normal. Pupils are equal, round, and reactive to light. Right eye exhibits no discharge. Left eye exhibits no discharge. No scleral icterus.  Neck: Normal range of motion. Neck supple. No tracheal deviation present. No thyromegaly present.  Cardiovascular: Normal rate, regular rhythm and normal heart sounds.  Exam reveals no gallop and no friction rub.   No murmur heard. Pulmonary/Chest: Effort normal and breath sounds normal. No respiratory distress. He has no wheezes. He has no rales. He exhibits no tenderness.  Musculoskeletal: Normal range of  motion. He exhibits no edema.  Lymphadenopathy:    He has no cervical adenopathy.  Neurological: He is alert and oriented to person, place, and time. No cranial nerve deficit. Coordination normal.  Skin: Skin is warm and dry. No rash noted. He is not diaphoretic. No erythema. No pallor.  Psychiatric: He has a normal mood and affect. His behavior is normal. Judgment and thought content normal.          Assessment & Plan:

## 2013-08-21 NOTE — Assessment & Plan Note (Signed)
Wt Readings from Last 3 Encounters:  08/21/13 193 lb (87.544 kg)  07/24/13 204 lb (92.534 kg)  04/23/13 204 lb (92.534 kg)   Congratulated pt on weight loss. Will continue phentermine to help with appetite suppression. Follow up 1 month and prn.

## 2013-09-23 ENCOUNTER — Encounter: Payer: Self-pay | Admitting: Internal Medicine

## 2013-09-23 ENCOUNTER — Ambulatory Visit (INDEPENDENT_AMBULATORY_CARE_PROVIDER_SITE_OTHER): Payer: No Typology Code available for payment source | Admitting: Internal Medicine

## 2013-09-23 VITALS — BP 124/88 | HR 73 | Temp 97.7°F | Wt 185.0 lb

## 2013-09-23 DIAGNOSIS — I10 Essential (primary) hypertension: Secondary | ICD-10-CM

## 2013-09-23 DIAGNOSIS — E669 Obesity, unspecified: Secondary | ICD-10-CM

## 2013-09-23 DIAGNOSIS — E119 Type 2 diabetes mellitus without complications: Secondary | ICD-10-CM

## 2013-09-23 DIAGNOSIS — E785 Hyperlipidemia, unspecified: Secondary | ICD-10-CM

## 2013-09-23 MED ORDER — PHENTERMINE HCL 37.5 MG PO CAPS: 37.5000 mg | ORAL_CAPSULE | ORAL | Status: DC

## 2013-09-23 NOTE — Assessment & Plan Note (Signed)
BP Readings from Last 3 Encounters:  09/23/13 124/88  08/21/13 110/82  07/24/13 122/86   Blood pressures well-controlled with lisinopril. Will continue.

## 2013-09-23 NOTE — Assessment & Plan Note (Signed)
Blood sugars well-controlled on metformin alone. Will plan to recheck A1c prior to next visit.

## 2013-09-23 NOTE — Assessment & Plan Note (Signed)
Wt Readings from Last 3 Encounters:  09/23/13 185 lb (83.915 kg)  08/21/13 193 lb (87.544 kg)  07/24/13 204 lb (92.534 kg)   Congratulated patient on weight loss. Encouraged him to continue healthy diet and regular physical activity. Will continue phentermine for appetite suppression. Followup 1-2 months.

## 2013-09-23 NOTE — Progress Notes (Signed)
Subjective:    Patient ID: Caleb Warren, male    DOB: [birth year], 52 y.o.   MRN: [id number]  HPI 52 year old male with history of hypertension, diabetes, obesity presents for followup. He continues to follow a healthy diet, low in processed carbohydrates and saturated fat. He has been exercising regularly. He has lost another 8 pounds over the last month. He continues on phentermine to help with appetite suppression. He denies any new concerns today.  Outpatient Encounter Prescriptions as of 09/23/2013  Medication Sig  . aspirin 81 MG tablet Take 81 mg by mouth daily.  . Cholecalciferol (VITAMIN D3 SUPER STRENGTH) 2000 UNITS TABS Take 1 tablet by mouth daily.  Marland Kitchen lisinopril (PRINIVIL,ZESTRIL) 20 MG tablet Take 1 tablet (20 mg total) by mouth daily.  . Magnesium 100 MG CAPS Take by mouth.  . metFORMIN (GLUCOPHAGE) 500 MG tablet TAKE 1 TABLET (500 MG TOTAL) BY MOUTH 2 (TWO) TIMES DAILY WITH A MEAL.  Marland Kitchen omeprazole (PRILOSEC) 20 MG capsule Take 1 capsule (20 mg total) by mouth as needed.  . phentermine 37.5 MG capsule Take 1 capsule (37.5 mg total) by mouth every morning.  . sildenafil (VIAGRA) 50 MG tablet Take 1 tablet (50 mg total) by mouth daily as needed for erectile dysfunction.  . simvastatin (ZOCOR) 20 MG tablet Take 1 tablet (20 mg total) by mouth at bedtime.  . [DISCONTINUED] phentermine 37.5 MG capsule Take 1 capsule (37.5 mg total) by mouth every morning.   BP 124/88  Pulse 73  Temp(Src) 97.7 F (36.5 C) (Oral)  Wt 185 lb (83.915 kg)  SpO2 97%  Review of Systems  Constitutional: Negative for fever, chills, activity change, appetite change, fatigue and unexpected weight change.  Eyes: Negative for visual disturbance.  Respiratory: Negative for cough and shortness of breath.   Cardiovascular: Negative for chest pain, palpitations and leg swelling.  Gastrointestinal: Negative for abdominal pain and abdominal distention.  Genitourinary: Negative for dysuria, urgency and difficulty  urinating.  Musculoskeletal: Negative for arthralgias and gait problem.  Skin: Negative for color change and rash.  Hematological: Negative for adenopathy.  Psychiatric/Behavioral: Negative for sleep disturbance and dysphoric mood. The patient is not nervous/anxious.        Objective:   Physical Exam  Constitutional: He is oriented to person, place, and time. He appears well-developed and well-nourished. No distress.  HENT:  Head: Normocephalic and atraumatic.  Right Ear: External ear normal.  Left Ear: External ear normal.  Nose: Nose normal.  Mouth/Throat: Oropharynx is clear and moist. No oropharyngeal exudate.  Eyes: Conjunctivae and EOM are normal. Pupils are equal, round, and reactive to light. Right eye exhibits no discharge. Left eye exhibits no discharge. No scleral icterus.  Neck: Normal range of motion. Neck supple. No tracheal deviation present. No thyromegaly present.  Cardiovascular: Normal rate, regular rhythm and normal heart sounds.  Exam reveals no gallop and no friction rub.   No murmur heard. Pulmonary/Chest: Effort normal and breath sounds normal. No respiratory distress. He has no wheezes. He has no rales. He exhibits no tenderness.  Musculoskeletal: Normal range of motion. He exhibits no edema.  Lymphadenopathy:    He has no cervical adenopathy.  Neurological: He is alert and oriented to person, place, and time. No cranial nerve deficit. Coordination normal.  Skin: Skin is warm and dry. No rash noted. He is not diaphoretic. No erythema. No pallor.  Psychiatric: He has a normal mood and affect. His behavior is normal. Judgment and thought content normal.  Assessment & Plan:

## 2013-10-28 ENCOUNTER — Encounter: Payer: Self-pay | Admitting: Internal Medicine

## 2013-11-19 ENCOUNTER — Ambulatory Visit: Payer: No Typology Code available for payment source | Admitting: Internal Medicine

## 2013-11-19 ENCOUNTER — Telehealth: Payer: Self-pay | Admitting: Internal Medicine

## 2013-11-19 DIAGNOSIS — E669 Obesity, unspecified: Secondary | ICD-10-CM

## 2013-11-19 NOTE — Telephone Encounter (Signed)
phentermine 37.5 MG capsule

## 2013-11-19 NOTE — Telephone Encounter (Signed)
Ok to refill 

## 2013-11-20 ENCOUNTER — Ambulatory Visit: Payer: No Typology Code available for payment source | Admitting: Adult Health

## 2013-11-20 MED ORDER — PHENTERMINE HCL 37.5 MG PO CAPS: 37.5000 mg | ORAL_CAPSULE | ORAL | Status: DC

## 2013-11-20 NOTE — Telephone Encounter (Signed)
Prescription faxed to [facility] pharmacy

## 2013-11-20 NOTE — Telephone Encounter (Signed)
Fine to refill x 1 month. 

## 2013-11-22 ENCOUNTER — Ambulatory Visit: Payer: No Typology Code available for payment source | Admitting: Internal Medicine

## 2013-11-25 ENCOUNTER — Telehealth: Payer: Self-pay | Admitting: Internal Medicine

## 2013-11-25 NOTE — Telephone Encounter (Signed)
FYI to Dr. Dan HumphreysWalker, Phentermine is already at pharmacy for pick up and patient is aware.

## 2013-11-25 NOTE — Telephone Encounter (Signed)
Pt came into clinic.  States BP is fine.  States he needs refill Phentermine HCL 37.5 mg tab.  Appt scheduled 12/12/13 for f/u

## 2013-12-12 ENCOUNTER — Ambulatory Visit (INDEPENDENT_AMBULATORY_CARE_PROVIDER_SITE_OTHER): Payer: No Typology Code available for payment source | Admitting: Internal Medicine

## 2013-12-12 ENCOUNTER — Encounter: Payer: Self-pay | Admitting: Internal Medicine

## 2013-12-12 VITALS — BP 100/80 | HR 76 | Temp 97.8°F | Ht 65.0 in | Wt 178.5 lb

## 2013-12-12 DIAGNOSIS — E663 Overweight: Secondary | ICD-10-CM | POA: Insufficient documentation

## 2013-12-12 DIAGNOSIS — I10 Essential (primary) hypertension: Secondary | ICD-10-CM

## 2013-12-12 DIAGNOSIS — E119 Type 2 diabetes mellitus without complications: Secondary | ICD-10-CM

## 2013-12-12 DIAGNOSIS — E669 Obesity, unspecified: Secondary | ICD-10-CM

## 2013-12-12 MED ORDER — PHENTERMINE HCL 37.5 MG PO CAPS: 37.5000 mg | ORAL_CAPSULE | ORAL | Status: DC

## 2013-12-12 NOTE — Progress Notes (Signed)
Pre-visit discussion using our clinic review tool. No additional management support is needed unless otherwise documented below in the visit note.  

## 2013-12-12 NOTE — Assessment & Plan Note (Signed)
Wt Readings from Last 3 Encounters:  12/12/13 178 lb 8 oz (80.967 kg)  09/23/13 185 lb (83.915 kg)  08/21/13 193 lb (87.544 kg)   30lb weight loss over last 4 months. Encouraged continued healthy diet, regular exercise. Will continue phentermine to help with appetite suppression. Follow up 3 months and prn.

## 2013-12-12 NOTE — Progress Notes (Signed)
   Subjective:    Patient ID: Caleb Warren, male    DOB: 08/07/1961, 53 y.o.   MRN: [id number]  HPI 53 year old male with history of diabetes, hypertension, hyperlipidemia, obesity presents for followup. He reports he is feeling well. He continues to follow a healthy diet and is exercising almost daily. He has lost nearly 50 pounds since last year. His recent A1c was 5.8%. He is compliant with medications. No new concerns today.  Outpatient Encounter Prescriptions as of 12/12/2013  Medication Sig  . aspirin 81 MG tablet Take 81 mg by mouth daily.  . Cholecalciferol (VITAMIN D3 SUPER STRENGTH) 2000 UNITS TABS Take 1 tablet by mouth daily.  Marland Kitchen. lisinopril (PRINIVIL,ZESTRIL) 20 MG tablet Take 1 tablet (20 mg total) by mouth daily.  . Magnesium 100 MG CAPS Take by mouth.  . metFORMIN (GLUCOPHAGE) 500 MG tablet TAKE 1 TABLET (500 MG TOTAL) BY MOUTH 2 (TWO) TIMES DAILY WITH A MEAL.  Marland Kitchen. omeprazole (PRILOSEC) 20 MG capsule Take 1 capsule (20 mg total) by mouth as needed.  . phentermine 37.5 MG capsule Take 1 capsule (37.5 mg total) by mouth every morning.  . simvastatin (ZOCOR) 20 MG tablet Take 1 tablet (20 mg total) by mouth at bedtime.   BP 100/80  Pulse 76  Temp(Src) 97.8 F (36.6 C) (Oral)  Ht 5\' 5"  (1.651 m)  Wt 178 lb 8 oz (80.967 kg)  BMI 29.70 kg/m2  SpO2 97%  Review of Systems  Constitutional: Negative for fever, chills, activity change, appetite change, fatigue and unexpected weight change.  Eyes: Negative for visual disturbance.  Respiratory: Negative for cough and shortness of breath.   Cardiovascular: Negative for chest pain, palpitations and leg swelling.  Gastrointestinal: Negative for abdominal pain and abdominal distention.  Genitourinary: Negative for dysuria, urgency and difficulty urinating.  Musculoskeletal: Negative for arthralgias and gait problem.  Skin: Negative for color change and rash.  Hematological: Negative for adenopathy.  Psychiatric/Behavioral: Negative for  sleep disturbance and dysphoric mood. The patient is not nervous/anxious.        Objective:   Physical Exam  Constitutional: He is oriented to person, place, and time. He appears well-developed and well-nourished. No distress.  HENT:  Head: Normocephalic and atraumatic.  Right Ear: External ear normal.  Left Ear: External ear normal.  Nose: Nose normal.  Mouth/Throat: Oropharynx is clear and moist. No oropharyngeal exudate.  Eyes: Conjunctivae and EOM are normal. Pupils are equal, round, and reactive to light. Right eye exhibits no discharge. Left eye exhibits no discharge. No scleral icterus.  Neck: Normal range of motion. Neck supple. No tracheal deviation present. No thyromegaly present.  Cardiovascular: Normal rate, regular rhythm and normal heart sounds.  Exam reveals no gallop and no friction rub.   No murmur heard. Pulmonary/Chest: Effort normal and breath sounds normal. No respiratory distress. He has no wheezes. He has no rales. He exhibits no tenderness.  Musculoskeletal: Normal range of motion. He exhibits no edema.  Lymphadenopathy:    He has no cervical adenopathy.  Neurological: He is alert and oriented to person, place, and time. No cranial nerve deficit. Coordination normal.  Skin: Skin is warm and dry. No rash noted. He is not diaphoretic. No erythema. No pallor.  Psychiatric: He has a normal mood and affect. His behavior is normal. Judgment and thought content normal.          Assessment & Plan:

## 2013-12-12 NOTE — Assessment & Plan Note (Signed)
BP Readings from Last 3 Encounters:  12/12/13 100/80  09/23/13 124/88  08/21/13 110/82   BP well controlled on Lisinopril. Will continue. Will check renal function with labs.

## 2013-12-12 NOTE — Assessment & Plan Note (Signed)
Last A1c 5.8%. Will continue Metformin for now, however we discussed stopping this medication if repeat A1c with labs <6%. Encouraged continued healthy diet and regular exercise.

## 2013-12-25 ENCOUNTER — Telehealth: Payer: Self-pay | Admitting: Internal Medicine

## 2013-12-25 NOTE — Telephone Encounter (Signed)
Relevant patient education assigned to patient using Emmi. ° °

## 2014-03-28 ENCOUNTER — Ambulatory Visit: Payer: No Typology Code available for payment source | Admitting: Internal Medicine

## 2014-05-09 LAB — HEMOGLOBIN A1C: HEMOGLOBIN A1C: 6 % (ref 4.0–6.0)

## 2014-05-12 LAB — LIPID PANEL
Cholesterol: 211 mg/dL — AB (ref 0–200)
HDL: 37 mg/dL (ref 35–70)
LDl/HDL Ratio: 5.7
Triglycerides: 420 mg/dL — AB (ref 40–160)

## 2014-05-12 LAB — BASIC METABOLIC PANEL: Glucose: 138 mg/dL

## 2014-05-20 ENCOUNTER — Encounter: Payer: Self-pay | Admitting: Internal Medicine

## 2014-05-20 ENCOUNTER — Ambulatory Visit (INDEPENDENT_AMBULATORY_CARE_PROVIDER_SITE_OTHER): Payer: No Typology Code available for payment source | Admitting: Internal Medicine

## 2014-05-20 VITALS — BP 100/82 | HR 69 | Temp 98.3°F | Ht 65.0 in | Wt 183.0 lb

## 2014-05-20 DIAGNOSIS — E669 Obesity, unspecified: Secondary | ICD-10-CM

## 2014-05-20 DIAGNOSIS — E785 Hyperlipidemia, unspecified: Secondary | ICD-10-CM

## 2014-05-20 DIAGNOSIS — Z13220 Encounter for screening for lipoid disorders: Secondary | ICD-10-CM | POA: Insufficient documentation

## 2014-05-20 DIAGNOSIS — Z1211 Encounter for screening for malignant neoplasm of colon: Secondary | ICD-10-CM

## 2014-05-20 DIAGNOSIS — H65191 Other acute nonsuppurative otitis media, right ear: Secondary | ICD-10-CM | POA: Insufficient documentation

## 2014-05-20 DIAGNOSIS — E119 Type 2 diabetes mellitus without complications: Secondary | ICD-10-CM

## 2014-05-20 DIAGNOSIS — H65199 Other acute nonsuppurative otitis media, unspecified ear: Secondary | ICD-10-CM

## 2014-05-20 DIAGNOSIS — I10 Essential (primary) hypertension: Secondary | ICD-10-CM

## 2014-05-20 LAB — HM DIABETES FOOT EXAM: HM Diabetic Foot Exam: NORMAL

## 2014-05-20 MED ORDER — PHENTERMINE HCL 37.5 MG PO CAPS: 37.5000 mg | ORAL_CAPSULE | ORAL | Status: DC

## 2014-05-20 MED ORDER — LISINOPRIL 20 MG PO TABS: 20.0000 mg | ORAL_TABLET | Freq: Every day | ORAL | Status: DC

## 2014-05-20 MED ORDER — AMOXICILLIN-POT CLAVULANATE 875-125 MG PO TABS: 1.0000 | ORAL_TABLET | Freq: Two times a day (BID) | ORAL | Status: DC

## 2014-05-20 MED ORDER — SIMVASTATIN 20 MG PO TABS: 20.0000 mg | ORAL_TABLET | Freq: Every day | ORAL | Status: DC

## 2014-05-20 MED ORDER — METFORMIN HCL 500 MG PO TABS: ORAL_TABLET | ORAL | Status: DC

## 2014-05-20 NOTE — Assessment & Plan Note (Signed)
Symptoms and exam c/w ROM. Will treat with Augmentin. Pt will call with update. If no improvement, favor ENT evaluation, as has h/o chronic infections, may ultimately need tympanostomy tubes.

## 2014-05-20 NOTE — Assessment & Plan Note (Signed)
Lipids show elevated TG 420. Unusually high for him. Will plan to recheck this month. Continue simvastatin. Rec Mediterranean style diet and regular exercise.

## 2014-05-20 NOTE — Assessment & Plan Note (Signed)
A1c 6%. Continue Metformin, healthy diet, exercise. Recheck 6 months.

## 2014-05-20 NOTE — Patient Instructions (Signed)
Start Augmentin twice daily to help treat right ear infection. Call if symptoms are persistent.  Please recheck cholesterol levels later this month at your convenience.  Follow up in 6 months.

## 2014-05-20 NOTE — Progress Notes (Signed)
Pre visit review using our clinic review tool, if applicable. No additional management support is needed unless otherwise documented below in the visit note. 

## 2014-05-20 NOTE — Progress Notes (Signed)
Subjective:    Patient ID: Caleb Warren, male    DOB: 10/22/1961, 53 y.o.   MRN: [id number]  HPI 52YO male presents for follow up.  Doing well. Compliant with medications.  Recent A1c 6%. Also noted to have TG of 420. Concerned about this, as increased from previous <150. No change in diet or exercise.  Only concern today is right ear pain. Attributes to allergies. No fever, chills, congestion.  Review of Systems  Constitutional: Negative for fever, chills, activity change, appetite change, fatigue and unexpected weight change.  HENT: Positive for ear pain. Negative for congestion, ear discharge, postnasal drip, rhinorrhea, sinus pressure, sore throat and trouble swallowing.   Eyes: Negative for visual disturbance.  Respiratory: Negative for cough and shortness of breath.   Cardiovascular: Negative for chest pain, palpitations and leg swelling.  Gastrointestinal: Negative for abdominal pain and abdominal distention.  Genitourinary: Negative for dysuria, urgency and difficulty urinating.  Musculoskeletal: Negative for arthralgias and gait problem.  Skin: Negative for color change and rash.  Hematological: Negative for adenopathy.  Psychiatric/Behavioral: Negative for sleep disturbance and dysphoric mood. The patient is not nervous/anxious.        Objective:    BP 100/82  Pulse 69  Temp(Src) 98.3 F (36.8 C) (Oral)  Ht 5\' 5"  (1.651 m)  Wt 183 lb (83.008 kg)  BMI 30.45 kg/m2  SpO2 95% Physical Exam  Constitutional: He is oriented to person, place, and time. He appears well-developed and well-nourished. No distress.  HENT:  Head: Normocephalic and atraumatic.  Right Ear: External ear normal. Tympanic membrane is scarred and retracted. A middle ear effusion is present.  Left Ear: Tympanic membrane and external ear normal.  Nose: Nose normal.  Mouth/Throat: Oropharynx is clear and moist. No oropharyngeal exudate.  Eyes: Conjunctivae and EOM are normal. Pupils are equal,  round, and reactive to light. Right eye exhibits no discharge. Left eye exhibits no discharge. No scleral icterus.  Neck: Normal range of motion. Neck supple. No tracheal deviation present. No thyromegaly present.  Cardiovascular: Normal rate, regular rhythm and normal heart sounds.  Exam reveals no gallop and no friction rub.   No murmur heard. Pulmonary/Chest: Effort normal and breath sounds normal. No accessory muscle usage. Not tachypneic. No respiratory distress. He has no decreased breath sounds. He has no wheezes. He has no rhonchi. He has no rales. He exhibits no tenderness.  Musculoskeletal: Normal range of motion. He exhibits no edema.  Lymphadenopathy:    He has no cervical adenopathy.  Neurological: He is alert and oriented to person, place, and time. No cranial nerve deficit. Coordination normal.  Skin: Skin is warm and dry. No rash noted. He is not diaphoretic. No erythema. No pallor.  Psychiatric: He has a normal mood and affect. His behavior is normal. Judgment and thought content normal.          Assessment & Plan:   Problem List Items Addressed This Visit     Unprioritized   Acute nonsuppurative otitis media of right ear     Symptoms and exam c/w ROM. Will treat with Augmentin. Pt will call with update. If no improvement, favor ENT evaluation, as has h/o chronic infections, may ultimately need tympanostomy tubes.    Relevant Medications      AMOXICILLIN-POT CLAVULANATE 875-125 MG PO TABS   Diabetes mellitus type 2, controlled - Primary     A1c 6%. Continue Metformin, healthy diet, exercise. Recheck 6 months.    Relevant Medications  metFORMIN (GLUCOPHAGE) tablet      lisinopril (PRINIVIL,ZESTRIL) tablet      simvastatin (ZOCOR) tablet   Hypertension      BP Readings from Last 3 Encounters:  05/20/14 100/82  12/12/13 100/80  09/23/13 124/88   BP well controlled on Lisinopril. Renal function normal. FU in 6 months.    Relevant Medications       lisinopril (PRINIVIL,ZESTRIL) tablet      simvastatin (ZOCOR) tablet   Obesity (BMI 30-39.9)      Wt Readings from Last 3 Encounters:  05/20/14 183 lb (83.008 kg)  12/12/13 178 lb 8 oz (80.967 kg)  09/23/13 185 lb (83.915 kg)   Body mass index is 30.45 kg/(m^2). Encouraged healthy diet and exercise. Will continue phentermine to help with appetite suppression.     Relevant Medications      phentermine capsule      metFORMIN (GLUCOPHAGE) tablet   Other and unspecified hyperlipidemia     Lipids show elevated TG 420. Unusually high for him. Will plan to recheck this month. Continue simvastatin. Rec Mediterranean style diet and regular exercise.    Relevant Medications      lisinopril (PRINIVIL,ZESTRIL) tablet      simvastatin (ZOCOR) tablet   Screening for colon cancer   Relevant Orders      Ambulatory referral to Gastroenterology       Return in about 6 months (around 11/19/2014) for Physical.

## 2014-05-20 NOTE — Assessment & Plan Note (Signed)
BP Readings from Last 3 Encounters:  05/20/14 100/82  12/12/13 100/80  09/23/13 124/88   BP well controlled on Lisinopril. Renal function normal. FU in 6 months.

## 2014-05-20 NOTE — Assessment & Plan Note (Signed)
Wt Readings from Last 3 Encounters:  05/20/14 183 lb (83.008 kg)  12/12/13 178 lb 8 oz (80.967 kg)  09/23/13 185 lb (83.915 kg)   Body mass index is 30.45 kg/(m^2). Encouraged healthy diet and exercise. Will continue phentermine to help with appetite suppression.

## 2014-06-11 ENCOUNTER — Encounter: Payer: Self-pay | Admitting: *Deleted

## 2014-06-13 ENCOUNTER — Encounter: Payer: Self-pay | Admitting: *Deleted

## 2014-06-13 NOTE — Progress Notes (Signed)
Chart reviewed for DM bundle.  05/10/15 a1c abstracted, within parameters. Sent mychart on need for repeat cholesterol.

## 2014-06-16 NOTE — Telephone Encounter (Signed)
Mailed unread message to pt  

## 2014-07-24 ENCOUNTER — Ambulatory Visit: Payer: Self-pay | Admitting: Unknown Physician Specialty

## 2014-07-24 LAB — HM COLONOSCOPY

## 2014-08-04 ENCOUNTER — Encounter: Payer: Self-pay | Admitting: *Deleted

## 2014-08-27 ENCOUNTER — Encounter: Payer: Self-pay | Admitting: Internal Medicine

## 2014-08-29 ENCOUNTER — Telehealth: Payer: Self-pay | Admitting: *Deleted

## 2014-08-29 DIAGNOSIS — E669 Obesity, unspecified: Secondary | ICD-10-CM

## 2014-08-29 MED ORDER — PHENTERMINE HCL 37.5 MG PO CAPS: 37.5000 mg | ORAL_CAPSULE | ORAL | Status: DC

## 2014-08-29 NOTE — Telephone Encounter (Signed)
Fine to refill 

## 2014-08-29 NOTE — Telephone Encounter (Signed)
Pt requesting refill on Phentermine, Okay to refill?

## 2014-09-17 LAB — LIPID PANEL
Cholesterol: 236 mg/dL — AB (ref 0–200)
HDL: 46 mg/dL (ref 35–70)
LDL CALC: 152 mg/dL
Triglycerides: 192 mg/dL — AB (ref 40–160)

## 2014-09-17 LAB — BASIC METABOLIC PANEL
BUN: 12 mg/dL (ref 4–21)
Creatinine: 1.1 mg/dL (ref 0.6–1.3)
Glucose: 108 mg/dL
Potassium: 4.5 mmol/L (ref 3.4–5.3)
SODIUM: 140 mmol/L (ref 137–147)

## 2014-09-17 LAB — HEPATIC FUNCTION PANEL
ALT: 18 U/L (ref 10–40)
AST: 18 U/L (ref 14–40)
Alkaline Phosphatase: 49 U/L (ref 25–125)
BILIRUBIN, TOTAL: 0.5 mg/dL

## 2014-09-17 LAB — TSH: TSH: 1.75 u[IU]/mL (ref 0.41–5.90)

## 2014-09-17 LAB — HEMOGLOBIN A1C: Hgb A1c MFr Bld: 5.9 % (ref 4.0–6.0)

## 2014-09-19 ENCOUNTER — Encounter: Payer: Self-pay | Admitting: *Deleted

## 2014-11-12 ENCOUNTER — Ambulatory Visit (INDEPENDENT_AMBULATORY_CARE_PROVIDER_SITE_OTHER): Payer: No Typology Code available for payment source | Admitting: Internal Medicine

## 2014-11-12 ENCOUNTER — Encounter: Payer: Self-pay | Admitting: Internal Medicine

## 2014-11-12 VITALS — BP 114/74 | HR 73 | Temp 97.7°F | Ht 65.0 in | Wt 195.2 lb

## 2014-11-12 DIAGNOSIS — E119 Type 2 diabetes mellitus without complications: Secondary | ICD-10-CM

## 2014-11-12 DIAGNOSIS — E669 Obesity, unspecified: Secondary | ICD-10-CM

## 2014-11-12 DIAGNOSIS — I10 Essential (primary) hypertension: Secondary | ICD-10-CM

## 2014-11-12 DIAGNOSIS — E785 Hyperlipidemia, unspecified: Secondary | ICD-10-CM

## 2014-11-12 MED ORDER — LISINOPRIL 20 MG PO TABS: 20.0000 mg | ORAL_TABLET | Freq: Every day | ORAL | Status: DC

## 2014-11-12 MED ORDER — PHENTERMINE HCL 37.5 MG PO CAPS: 37.5000 mg | ORAL_CAPSULE | ORAL | Status: DC

## 2014-11-12 MED ORDER — METFORMIN HCL 500 MG PO TABS: ORAL_TABLET | ORAL | Status: DC

## 2014-11-12 MED ORDER — SIMVASTATIN 20 MG PO TABS: 20.0000 mg | ORAL_TABLET | Freq: Every day | ORAL | Status: DC

## 2014-11-12 NOTE — Assessment & Plan Note (Signed)
Wt Readings from Last 3 Encounters:  11/12/14 195 lb 4 oz (88.565 kg)  05/20/14 183 lb (83.008 kg)  12/12/13 178 lb 8 oz (80.967 kg)   Body mass index is 32.49 kg/(m^2). Will restart phentermine to help with appetite suppression. Follow up in 4 weeks.

## 2014-11-12 NOTE — Assessment & Plan Note (Signed)
Will check lipids with labs in Jan 2016. Continue Simvastatin.

## 2014-11-12 NOTE — Assessment & Plan Note (Signed)
Will check A1c with labs in Jan 2016. Continue Metformin.

## 2014-11-12 NOTE — Assessment & Plan Note (Signed)
BP Readings from Last 3 Encounters:  11/12/14 114/74  05/20/14 100/82  12/12/13 100/80   BP well controlled on current medications. Renal function with labs in Jan 2016.

## 2014-11-12 NOTE — Patient Instructions (Signed)
Labs end of January.  Follow up early February.

## 2014-11-12 NOTE — Progress Notes (Signed)
Subjective:    Patient ID: Caleb Warren, male    DOB: 09/27/1961, 53 y.o.   MRN: [id number]  HPI 53YO male presents for follow up.  [place] Notes some dietary indiscretion with weight gain. Would like to get back on phentermine.   Wt Readings from Last 3 Encounters:  11/12/14 195 lb 4 oz (88.565 kg)  05/20/14 183 lb (83.008 kg)  12/12/13 178 lb 8 oz (80.967 kg)   Feeling well. No concerns today. Compliant with medications.  Past medical, surgical, family and social history per today's encounter.  Review of Systems  Constitutional: Negative for fever, chills, activity change, appetite change, fatigue and unexpected weight change.  Eyes: Negative for visual disturbance.  Respiratory: Negative for cough and shortness of breath.   Cardiovascular: Negative for chest pain, palpitations and leg swelling.  Gastrointestinal: Negative for nausea, vomiting, abdominal pain, diarrhea, constipation and abdominal distention.  Genitourinary: Negative for dysuria, urgency and difficulty urinating.  Musculoskeletal: Negative for arthralgias and gait problem.  Skin: Negative for color change and rash.  Hematological: Negative for adenopathy.  Psychiatric/Behavioral: Negative for sleep disturbance and dysphoric mood. The patient is not nervous/anxious.        Objective:    BP 114/74 mmHg  Pulse 73  Temp(Src) 97.7 F (36.5 C) (Oral)  Ht 5\' 5"  (1.651 m)  Wt 195 lb 4 oz (88.565 kg)  BMI 32.49 kg/m2  SpO2 96% Physical Exam  Constitutional: He is oriented to person, place, and time. He appears well-developed and well-nourished. No distress.  HENT:  Head: Normocephalic and atraumatic.  Right Ear: External ear normal.  Left Ear: External ear normal.  Nose: Nose normal.  Mouth/Throat: Oropharynx is clear and moist. No oropharyngeal exudate.  Eyes: Conjunctivae and EOM are normal. Pupils are equal, round, and reactive to light. Right eye exhibits no discharge. Left  eye exhibits no discharge. No scleral icterus.  Neck: Normal range of motion. Neck supple. No tracheal deviation present. No thyromegaly present.  Cardiovascular: Normal rate, regular rhythm and normal heart sounds.  Exam reveals no gallop and no friction rub.   No murmur heard. Pulmonary/Chest: Effort normal and breath sounds normal. No accessory muscle usage. No tachypnea. No respiratory distress. He has no decreased breath sounds. He has no wheezes. He has no rhonchi. He has no rales. He exhibits no tenderness.  Musculoskeletal: Normal range of motion. He exhibits no edema.  Lymphadenopathy:    He has no cervical adenopathy.  Neurological: He is alert and oriented to person, place, and time. No cranial nerve deficit. Coordination normal.  Skin: Skin is warm and dry. No rash noted. He is not diaphoretic. No erythema. No pallor.  Psychiatric: He has a normal mood and affect. His behavior is normal. Judgment and thought content normal.          Assessment & Plan:   Problem List Items Addressed This Visit      Unprioritized   Diabetes mellitus type 2, controlled - Primary    Will check A1c with labs in Jan 2016. Continue Metformin.    Relevant Medications      simvastatin (ZOCOR) tablet      lisinopril (PRINIVIL,ZESTRIL) tablet      metFORMIN (GLUCOPHAGE) tablet   Hyperlipidemia    Will check lipids with labs in Jan 2016. Continue Simvastatin.    Relevant Medications      simvastatin (ZOCOR) tablet      lisinopril (PRINIVIL,ZESTRIL) tablet   Hypertension    BP  Readings from Last 3 Encounters:  11/12/14 114/74  05/20/14 100/82  12/12/13 100/80   BP well controlled on current medications. Renal function with labs in Jan 2016.    Relevant Medications      simvastatin (ZOCOR) tablet      lisinopril (PRINIVIL,ZESTRIL) tablet   Obesity (BMI 30-39.9)    Wt Readings from Last 3 Encounters:  11/12/14 195 lb 4 oz (88.565 kg)  05/20/14 183 lb (83.008 kg)  12/12/13 178 lb 8 oz  (80.967 kg)   Body mass index is 32.49 kg/(m^2). Will restart phentermine to help with appetite suppression. Follow up in 4 weeks.    Relevant Medications      phentermine capsule      metFORMIN (GLUCOPHAGE) tablet       Return in about 6 weeks (around 12/24/2014) for Recheck of Diabetes.

## 2014-11-12 NOTE — Progress Notes (Signed)
Pre visit review using our clinic review tool, if applicable. No additional management support is needed unless otherwise documented below in the visit note. 

## 2014-11-19 ENCOUNTER — Ambulatory Visit: Payer: No Typology Code available for payment source | Admitting: Internal Medicine

## 2014-11-21 HISTORY — PX: COLONOSCOPY: SHX174

## 2014-12-25 ENCOUNTER — Ambulatory Visit: Payer: No Typology Code available for payment source | Admitting: Internal Medicine

## 2015-01-12 LAB — BASIC METABOLIC PANEL
BUN: 13 mg/dL (ref 4–21)
Creatinine: 1 mg/dL (ref 0.6–1.3)
Glucose: 122 mg/dL
POTASSIUM: 4.6 mmol/L (ref 3.4–5.3)
SODIUM: 141 mmol/L (ref 137–147)

## 2015-01-12 LAB — HEPATIC FUNCTION PANEL
ALT: 19 U/L (ref 10–40)
AST: 18 U/L (ref 14–40)
Alkaline Phosphatase: 48 U/L (ref 25–125)
Bilirubin, Total: 0.7 mg/dL

## 2015-01-12 LAB — CBC AND DIFFERENTIAL
HCT: 46 % (ref 41–53)
Hemoglobin: 15.5 g/dL (ref 13.5–17.5)
Neutrophils Absolute: 4 /uL
Platelets: 213 10*3/uL (ref 150–399)
WBC: 6.9 10*3/mL

## 2015-01-12 LAB — LIPID PANEL
CHOLESTEROL: 244 mg/dL — AB (ref 0–200)
HDL: 45 mg/dL (ref 35–70)
LDL Cholesterol: 145 mg/dL
LDl/HDL Ratio: 5.4
TRIGLYCERIDES: 268 mg/dL — AB (ref 40–160)

## 2015-01-12 LAB — HEMOGLOBIN A1C: HEMOGLOBIN A1C: 6.4 % — AB (ref 4.0–6.0)

## 2015-01-12 LAB — TSH: TSH: 1.53 u[IU]/mL (ref 0.41–5.90)

## 2015-01-16 ENCOUNTER — Encounter: Payer: Self-pay | Admitting: Internal Medicine

## 2015-01-16 ENCOUNTER — Ambulatory Visit (INDEPENDENT_AMBULATORY_CARE_PROVIDER_SITE_OTHER): Payer: No Typology Code available for payment source | Admitting: Internal Medicine

## 2015-01-16 VITALS — BP 144/87 | HR 76 | Temp 97.9°F | Ht 65.0 in | Wt 199.0 lb

## 2015-01-16 DIAGNOSIS — E669 Obesity, unspecified: Secondary | ICD-10-CM

## 2015-01-16 DIAGNOSIS — E785 Hyperlipidemia, unspecified: Secondary | ICD-10-CM

## 2015-01-16 DIAGNOSIS — E119 Type 2 diabetes mellitus without complications: Secondary | ICD-10-CM

## 2015-01-16 DIAGNOSIS — I10 Essential (primary) hypertension: Secondary | ICD-10-CM

## 2015-01-16 MED ORDER — PHENTERMINE HCL 37.5 MG PO CAPS: 37.5000 mg | ORAL_CAPSULE | ORAL | Status: DC

## 2015-01-16 MED ORDER — ATORVASTATIN CALCIUM 20 MG PO TABS: 20.0000 mg | ORAL_TABLET | Freq: Every day | ORAL | Status: DC

## 2015-01-16 NOTE — Assessment & Plan Note (Signed)
Recent LDL was 145. Discussed this is above goal. Will change to Atorvastatin. Recheck lipids and LFTs in 3 months and prn.

## 2015-01-16 NOTE — Progress Notes (Signed)
Subjective:    Patient ID: Caleb Warren, male    DOB: [birth year], 54 y.o.   MRN: [id number]  HPI  54YO male presents for follow up.  Recently treated for sinus infection with Azithromycin and Prednisone. Symptoms are improving.  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 Time for exercise has been limited, but trying to walk 10K steps daily. Following healthy diet. Compliant with medication.   Wt Readings from Last 3 Encounters:  01/16/15 199 lb (90.266 kg)  11/12/14 195 lb 4 oz (88.565 kg)  05/20/14 183 lb (83.008 kg)    Past medical, surgical, family and social history per today's encounter.  Review of Systems  Constitutional: Negative for fever, chills, activity change, appetite change, fatigue and unexpected weight change.  Eyes: Negative for visual disturbance.  Respiratory: Negative for cough and shortness of breath.   Cardiovascular: Negative for chest pain, palpitations and leg swelling.  Gastrointestinal: Negative for nausea, vomiting, abdominal pain, diarrhea, constipation and abdominal distention.  Genitourinary: Negative for dysuria, urgency and difficulty urinating.  Musculoskeletal: Negative for arthralgias and gait problem.  Skin: Negative for color change and rash.  Hematological: Negative for adenopathy.  Psychiatric/Behavioral: Negative for sleep disturbance and dysphoric mood. The patient is not nervous/anxious.        Objective:    BP 144/87 mmHg  Pulse 76  Temp(Src) 97.9 F (36.6 C) (Oral)  Ht  (1.651 m)  Wt 199 lb (90.266 kg)  BMI 33.12 kg/m2  SpO2 98% Physical Exam  Constitutional: He is oriented to person, place, and time. He appears well-developed and well-nourished. No distress.  HENT:  Head: Normocephalic and atraumatic.  Right Ear: External ear normal. A middle ear effusion is present.  Left Ear: External ear normal. A middle ear effusion is present.  Nose: Nose normal.  Mouth/Throat: Oropharynx is clear and moist. No  oropharyngeal exudate.  Eyes: Conjunctivae and EOM are normal. Pupils are equal, round, and reactive to light. Right eye exhibits no discharge. Left eye exhibits no discharge. No scleral icterus.  Neck: Normal range of motion. Neck supple. No tracheal deviation present. No thyromegaly present.  Cardiovascular: Normal rate, regular rhythm and normal heart sounds.  Exam reveals no gallop and no friction rub.   No murmur heard. Pulmonary/Chest: Effort normal and breath sounds normal. No accessory muscle usage. No tachypnea. No respiratory distress. He has no decreased breath sounds. He has no wheezes. He has no rhonchi. He has no rales. He exhibits no tenderness.  Musculoskeletal: Normal range of motion. He exhibits no edema.  Lymphadenopathy:    He has no cervical adenopathy.  Neurological: He is alert and oriented to person, place, and time. No cranial nerve deficit. Coordination normal.  Skin: Skin is warm and dry. No rash noted. He is not diaphoretic. No erythema. No pallor.  Psychiatric: He has a normal mood and affect. His behavior is normal. Judgment and thought content normal.          Assessment & Plan:   Problem List Items Addressed This Visit      Unprioritized   Diabetes mellitus type 2, controlled    Recent A1c. 6.4% Will continue Metformin. Encouraged healthy diet and exercise.      Relevant Medications   atorvastatin (LIPITOR) tablet   Hyperlipidemia - Primary    Recent LDL was 145. Discussed this is above goal. Will change to Atorvastatin. Recheck lipids and LFTs in 3 months and prn.      Relevant Medications  atorvastatin (LIPITOR) tablet   Hypertension    BP Readings from Last 3 Encounters:  01/16/15 144/87  11/12/14 114/74  05/20/14 100/82   BP well controlled typically, however slightly elevated today. Will continue current medication. Recent renal function normal.      Relevant Medications   atorvastatin (LIPITOR) tablet   Obesity (BMI 30-39.9)    Wt  Readings from Last 3 Encounters:  01/16/15 199 lb (90.266 kg)  11/12/14 195 lb 4 oz (88.565 kg)  05/20/14 183 lb (83.008 kg)   Body mass index is 33.12 kg/(m^2). Encouraged healthy diet and exercise. Will restart phentermine to help with appetite suppression. Follow up recheck in 3 months. He will also monitor BP at home.      Relevant Medications   phentermine capsule       Return in about 3 months (around 04/16/2015) for Recheck of Diabetes.

## 2015-01-16 NOTE — Progress Notes (Signed)
Pre visit review using our clinic review tool, if applicable. No additional management support is needed unless otherwise documented below in the visit note. 

## 2015-01-16 NOTE — Assessment & Plan Note (Signed)
BP Readings from Last 3 Encounters:  01/16/15 144/87  11/12/14 114/74  05/20/14 100/82   BP well controlled typically, however slightly elevated today. Will continue current medication. Recent renal function normal.

## 2015-01-16 NOTE — Assessment & Plan Note (Signed)
Wt Readings from Last 3 Encounters:  01/16/15 199 lb (90.266 kg)  11/12/14 195 lb 4 oz (88.565 kg)  05/20/14 183 lb (83.008 kg)   Body mass index is 33.12 kg/(m^2). Encouraged healthy diet and exercise. Will restart phentermine to help with appetite suppression. Follow up recheck in 3 months. He will also monitor BP at home.

## 2015-01-16 NOTE — Assessment & Plan Note (Signed)
Recent A1c. 6.4% Will continue Metformin. Encouraged healthy diet and exercise.

## 2015-01-16 NOTE — Patient Instructions (Signed)
Stop Simvastatin.  Start Atorvastatin 20mg  daily.  Repeat labs in 3 months.

## 2015-04-02 ENCOUNTER — Telehealth: Payer: Self-pay | Admitting: Internal Medicine

## 2015-04-02 NOTE — Telephone Encounter (Signed)
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 Waiver in Dr. Tilman NeatWalker's box/msn

## 2015-04-06 NOTE — Telephone Encounter (Signed)
Forms ready for pick up, notified pt

## 2015-04-15 ENCOUNTER — Ambulatory Visit: Payer: No Typology Code available for payment source | Admitting: Internal Medicine

## 2015-04-24 ENCOUNTER — Other Ambulatory Visit: Payer: Self-pay | Admitting: Internal Medicine

## 2015-04-24 NOTE — Telephone Encounter (Signed)
Last OV 2.26.16, next OV 6.14.16. Cancelled OV on 5.25.16. Please advise

## 2015-05-05 ENCOUNTER — Ambulatory Visit: Payer: No Typology Code available for payment source | Admitting: Internal Medicine

## 2016-06-09 ENCOUNTER — Telehealth: Payer: Self-pay | Admitting: Internal Medicine

## 2016-06-09 NOTE — Telephone Encounter (Signed)
Noted, brock did you put up front? Or notify patient?

## 2016-06-09 NOTE — Telephone Encounter (Signed)
Pt would like to get labs done before his physical appt on 08/31. Pt would like a call so he can pick up the lab order to get it done at his job. Thank you!  Call pt @ [phone].

## 2016-06-09 NOTE — Telephone Encounter (Signed)
Order is up front ready to be picked up.

## 2016-06-09 NOTE — Telephone Encounter (Signed)
Patient's last labs were in February, please advise and order and patient will pick up orders to take to his job to complete. thanks

## 2016-06-09 NOTE — Telephone Encounter (Signed)
Orders written. We can leave these up front

## 2016-07-21 ENCOUNTER — Encounter: Payer: Self-pay | Admitting: Family Medicine

## 2016-07-21 ENCOUNTER — Ambulatory Visit (INDEPENDENT_AMBULATORY_CARE_PROVIDER_SITE_OTHER): Payer: Managed Care, Other (non HMO) | Admitting: Family Medicine

## 2016-07-21 DIAGNOSIS — Z0000 Encounter for general adult medical examination without abnormal findings: Secondary | ICD-10-CM | POA: Diagnosis not present

## 2016-07-21 DIAGNOSIS — Z0001 Encounter for general adult medical examination with abnormal findings: Secondary | ICD-10-CM | POA: Insufficient documentation

## 2016-07-21 NOTE — Assessment & Plan Note (Addendum)
Patient is overall doing well. Needs to lose some weight. Discussed increasing exercise and monitoring diet. He'll get his flu shot at work. We will check a PSA today. We'll check hepatitis C and HIV. We'll check lipid panel, CMP, and A1c as well. He is given a handwritten prescription to get his labs through the employee clinic.

## 2016-07-21 NOTE — Progress Notes (Signed)
Pre visit review using our clinic review tool, if applicable. No additional management support is needed unless otherwise documented below in the visit note. 

## 2016-07-21 NOTE — Progress Notes (Signed)
Caleb AlarEric Kaidan Spengler, MD Phone: [phone]  Caleb Warren is a 55 y.o. male who presents today for physical exam.  Exercises 1-2 times a week. Diet is better than previously. Tries to get fruits and vegetables and healthy meats. Tetanus vaccination up-to-date. He'll get his flu vaccination through work. Colonoscopy is up-to-date. Does want prostate cancer screening. No prior history of hepatitis C or HIV testing. Is up-to-date on pneumonia vaccine. No tobacco use or illicit drug use. Rare alcohol use. Sees an ophthalmologist once a year. Sees his dentist twice a year.  Active Ambulatory Problems    Diagnosis Date Noted  . Diabetes mellitus type 2, controlled (HCC) 05/25/2012  . Hypertension 05/25/2012  . Hyperlipidemia 05/25/2012  . Low testosterone 07/12/2012  . 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 Obesity (BMI 30-39.9) 05/20/2014  . Lipid screening 05/20/2014  . Routine general medical examination at a health care facility 07/21/2016   Resolved Ambulatory Problems    Diagnosis Date Noted  . Fatigue 05/25/2012  . Obesity (BMI 30-39.9) 05/25/2012  . Skin lesion of scalp 07/12/2012  . Other malaise and fatigue 03/07/2013  . Decreased libido 07/24/2013  . Overweight (BMI 25.0-29.9) 12/12/2013  . Acute nonsuppurative otitis media of right ear 05/20/2014   Past Medical History:  Diagnosis Date  . Chicken pox   . Diverticulitis   . GERD (gastroesophageal reflux disease)   . Hyperlipidemia   . Hypertension   . Kidney stone     Family History  Problem Relation Age of Onset  . Hyperlipidemia Mother   . Hypertension Mother   . Hyperlipidemia Father   . Hypertension Father   . Arthritis Maternal Grandmother   . Cancer Maternal Grandmother     breast  . Arthritis Maternal Grandfather   . Cancer Paternal Uncle     colon    Social History   Social History  . Marital status: Married    Spouse name: N/A  . Number of children: N/A  . Years of education: N/A    Occupational History  . Not on file.   Social History Main Topics  . Smoking status: Never Smoker  . Smokeless tobacco: Not on file  . Alcohol use Yes  . Drug use: No  . Sexual activity: Not on file   Other Topics Concern  . Not on file   Social History Narrative   Lives in [place]. No pets. Works for [employer].      Diet - regular diet, limited red meat   Exercise - walks    ROS  General:  Negative for nexplained weight loss, fever Skin: Negative for new or changing mole, sore that won't heal HEENT: Negative for trouble hearing, trouble seeing, ringing in ears, mouth sores, hoarseness, change in voice, dysphagia. CV:  Negative for chest pain, dyspnea, edema, palpitations Resp: Negative for cough, dyspnea, hemoptysis GI: Negative for nausea, vomiting, diarrhea, constipation, abdominal pain, melena, hematochezia. GU: Negative for dysuria, incontinence, urinary hesitance, hematuria, vaginal or penile discharge, polyuria, sexual difficulty, lumps in testicle or breasts MSK: Negative for muscle cramps or aches, joint pain or swelling Neuro: Negative for headaches, weakness, numbness, dizziness, passing out/fainting Psych: Negative for depression, anxiety, memory problems  Objective  Physical Exam Vitals:   07/21/16 1004  BP: 126/88  Pulse: 82  Temp: 98 F (36.7 C)    BP Readings from Last 3 Encounters:  07/21/16 126/88  01/16/15 (!) 144/87  11/12/14 114/74   Wt Readings from Last 3 Encounters:  07/21/16 211  lb 9.6 oz (96 kg)  01/16/15 199 lb (90.3 kg)  11/12/14 195 lb 4 oz (88.6 kg)    Physical Exam  Constitutional: No distress.  HENT:  Head: Normocephalic and atraumatic.  Mouth/Throat: Oropharynx is clear and moist. No oropharyngeal exudate.  Eyes: Conjunctivae are normal. Pupils are equal, round, and reactive to light.  Cardiovascular: Normal rate, regular rhythm and normal heart sounds.   Pulmonary/Chest: Effort normal and breath sounds  normal.  Abdominal: Soft. Bowel sounds are normal. He exhibits no distension. There is no tenderness. There is no rebound and no guarding.  Genitourinary: Rectum normal and prostate normal.  Musculoskeletal: He exhibits no edema.  Neurological: He is alert. Gait normal.  Skin: Skin is warm and dry. He is not diaphoretic.  Psychiatric: Mood and affect normal.     Assessment/Plan:   Routine general medical examination at a health care facility Patient is overall doing well. Needs to lose some weight. Discussed increasing exercise and monitoring diet. He'll get his flu shot at work. We will check a PSA today. We'll check hepatitis C and HIV. We'll check lipid panel, CMP, and A1c as well. He is given a handwritten prescription to get his labs through the employee clinic.    Caleb Alar, MD [facility]

## 2016-07-21 NOTE — Patient Instructions (Signed)
Nice to meet you. We will check some lab work through the employee clinic and he should do this fasting. We will call with the results. Please continue to watch what you eat and try to exercise more. Make sure you get a flu shot through work.

## 2016-08-03 ENCOUNTER — Other Ambulatory Visit: Payer: Self-pay

## 2016-08-03 DIAGNOSIS — Z299 Encounter for prophylactic measures, unspecified: Secondary | ICD-10-CM

## 2016-08-03 NOTE — Progress Notes (Signed)
Patient came in to have blood drawn for testing per Dr. Birdie SonsSonnenberg authorization.

## 2016-08-04 LAB — CMP12+LP+TP+TSH+6AC+PSA+CBC…
A/G RATIO: 1.8 (ref 1.2–2.2)
ALT: 35 IU/L (ref 0–44)
AST: 24 IU/L (ref 0–40)
Albumin: 4.4 g/dL (ref 3.5–5.5)
Alkaline Phosphatase: 63 IU/L (ref 39–117)
BASOS ABS: 0 10*3/uL (ref 0.0–0.2)
BILIRUBIN TOTAL: 0.2 mg/dL (ref 0.0–1.2)
BUN/Creatinine Ratio: 12 (ref 9–20)
BUN: 11 mg/dL (ref 6–24)
Basos: 1 %
CHOL/HDL RATIO: 7.3 ratio — AB (ref 0.0–5.0)
CREATININE: 0.95 mg/dL (ref 0.76–1.27)
Calcium: 8.7 mg/dL (ref 8.7–10.2)
Chloride: 102 mmol/L (ref 96–106)
Cholesterol, Total: 234 mg/dL — ABNORMAL HIGH (ref 100–199)
EOS (ABSOLUTE): 0.1 10*3/uL (ref 0.0–0.4)
Eos: 2 %
Estimated CHD Risk: 1.5 times avg. — ABNORMAL HIGH (ref 0.0–1.0)
Free Thyroxine Index: 1.6 (ref 1.2–4.9)
GFR, EST AFRICAN AMERICAN: 106 mL/min/{1.73_m2} (ref >59)
GFR, EST NON AFRICAN AMERICAN: 92 mL/min/{1.73_m2} (ref >59)
GGT: 31 IU/L (ref 0–65)
GLUCOSE: 179 mg/dL — AB (ref 65–99)
Globulin, Total: 2.4 g/dL (ref 1.5–4.5)
HDL: 32 mg/dL — AB (ref >39)
HEMATOCRIT: 45.1 % (ref 37.5–51.0)
Hemoglobin: 15.2 g/dL (ref 12.6–17.7)
IMMATURE GRANS (ABS): 0 10*3/uL (ref 0.0–0.1)
IMMATURE GRANULOCYTES: 0 %
Iron: 50 ug/dL (ref 38–169)
LDH: 165 IU/L (ref 121–224)
LYMPHS: 35 %
Lymphocytes Absolute: 2.2 10*3/uL (ref 0.7–3.1)
MCH: 30 pg (ref 26.6–33.0)
MCHC: 33.7 g/dL (ref 31.5–35.7)
MCV: 89 fL (ref 79–97)
MONOCYTES: 9 %
MONOS ABS: 0.6 10*3/uL (ref 0.1–0.9)
NEUTROS PCT: 53 %
Neutrophils Absolute: 3.4 10*3/uL (ref 1.4–7.0)
POTASSIUM: 4.4 mmol/L (ref 3.5–5.2)
PROSTATE SPECIFIC AG, SERUM: 0.3 ng/mL (ref 0.0–4.0)
Phosphorus: 2.5 mg/dL (ref 2.5–4.5)
Platelets: 196 10*3/uL (ref 150–379)
RBC: 5.07 x10E6/uL (ref 4.14–5.80)
RDW: 13.5 % (ref 12.3–15.4)
Sodium: 144 mmol/L (ref 134–144)
T3 UPTAKE RATIO: 24 % (ref 24–39)
T4, Total: 6.6 ug/dL (ref 4.5–12.0)
TRIGLYCERIDES: 523 mg/dL — AB (ref 0–149)
TSH: 2.78 u[IU]/mL (ref 0.450–4.500)
Total Protein: 6.8 g/dL (ref 6.0–8.5)
URIC ACID: 6.9 mg/dL (ref 3.7–8.6)
WBC: 6.4 10*3/uL (ref 3.4–10.8)

## 2016-08-04 LAB — HIV ANTIBODY (ROUTINE TESTING W REFLEX): HIV Screen 4th Generation wRfx: NONREACTIVE

## 2016-08-04 LAB — HEPATITIS C ANTIBODY (REFLEX)

## 2016-08-04 LAB — HGB A1C W/O EAG: HEMOGLOBIN A1C: 8.1 % — AB (ref 4.8–5.6)

## 2016-08-04 LAB — HCV COMMENT:

## 2016-08-08 ENCOUNTER — Other Ambulatory Visit: Payer: Self-pay | Admitting: Family Medicine

## 2016-08-08 MED ORDER — METFORMIN HCL 500 MG PO TABS: 500.0000 mg | ORAL_TABLET | Freq: Two times a day (BID) | ORAL | 3 refills | Status: DC

## 2016-08-09 ENCOUNTER — Telehealth: Payer: Self-pay | Admitting: Family Medicine

## 2016-08-09 ENCOUNTER — Other Ambulatory Visit: Payer: Self-pay | Admitting: Family Medicine

## 2016-08-09 NOTE — Telephone Encounter (Signed)
Pt called about his Rx for Lipitor. Please advise?  Pharmacy is [facility] - [place], [place] - [address]  Call pt @ [phone]. Thank you!

## 2016-08-09 NOTE — Telephone Encounter (Signed)
No lipids since 12/2014 last OV 8/17 ok to fill Lipitor?

## 2016-08-09 NOTE — Telephone Encounter (Signed)
This was sent today, thanks

## 2016-08-10 NOTE — Telephone Encounter (Signed)
Lipids are located in the male executive panel. This was sent to his pharmacy.

## 2016-12-14 ENCOUNTER — Encounter: Payer: Self-pay | Admitting: Pain Medicine

## 2016-12-14 ENCOUNTER — Ambulatory Visit: Payer: Managed Care, Other (non HMO) | Attending: Pain Medicine | Admitting: Pain Medicine

## 2016-12-14 VITALS — BP 122/82 | HR 77 | Temp 97.9°F | Resp 16 | Ht 66.0 in | Wt 200.0 lb

## 2016-12-14 DIAGNOSIS — I10 Essential (primary) hypertension: Secondary | ICD-10-CM | POA: Diagnosis not present

## 2016-12-14 DIAGNOSIS — Z79899 Other long term (current) drug therapy: Secondary | ICD-10-CM | POA: Insufficient documentation

## 2016-12-14 DIAGNOSIS — Z7982 Long term (current) use of aspirin: Secondary | ICD-10-CM | POA: Diagnosis not present

## 2016-12-14 DIAGNOSIS — Z7984 Long term (current) use of oral hypoglycemic drugs: Secondary | ICD-10-CM | POA: Insufficient documentation

## 2016-12-14 DIAGNOSIS — E785 Hyperlipidemia, unspecified: Secondary | ICD-10-CM | POA: Insufficient documentation

## 2016-12-14 DIAGNOSIS — M2580 Other specified joint disorders, unspecified joint: Secondary | ICD-10-CM | POA: Insufficient documentation

## 2016-12-14 DIAGNOSIS — E11618 Type 2 diabetes mellitus with other diabetic arthropathy: Secondary | ICD-10-CM

## 2016-12-14 DIAGNOSIS — M1990 Unspecified osteoarthritis, unspecified site: Secondary | ICD-10-CM | POA: Insufficient documentation

## 2016-12-14 DIAGNOSIS — M79642 Pain in left hand: Secondary | ICD-10-CM | POA: Diagnosis not present

## 2016-12-14 DIAGNOSIS — Z0001 Encounter for general adult medical examination with abnormal findings: Secondary | ICD-10-CM | POA: Diagnosis not present

## 2016-12-14 DIAGNOSIS — Z6832 Body mass index (BMI) 32.0-32.9, adult: Secondary | ICD-10-CM | POA: Insufficient documentation

## 2016-12-14 DIAGNOSIS — M79641 Pain in right hand: Secondary | ICD-10-CM | POA: Insufficient documentation

## 2016-12-14 DIAGNOSIS — E119 Type 2 diabetes mellitus without complications: Secondary | ICD-10-CM | POA: Insufficient documentation

## 2016-12-14 MED ORDER — METHYLPREDNISOLONE ACETATE 80 MG/ML IJ SUSP
80.0000 mg | Freq: Once | INTRAMUSCULAR | Status: AC
  Administered 2016-12-14: 15:00:00 via INTRA_ARTICULAR

## 2016-12-14 MED ORDER — LIDOCAINE HCL (PF) 1 % IJ SOLN
INTRAMUSCULAR | Status: AC
  Filled 2016-12-14: qty 5

## 2016-12-14 MED ORDER — METHYLPREDNISOLONE ACETATE 80 MG/ML IJ SUSP
INTRAMUSCULAR | Status: AC
  Filled 2016-12-14: qty 1

## 2016-12-14 MED ORDER — ROPIVACAINE HCL 2 MG/ML IJ SOLN
4.0000 mL | Freq: Once | INTRAMUSCULAR | Status: AC
  Administered 2016-12-14: 15:00:00

## 2016-12-14 MED ORDER — ROPIVACAINE HCL 2 MG/ML IJ SOLN
INTRAMUSCULAR | Status: AC
  Filled 2016-12-14: qty 10

## 2016-12-14 MED ORDER — LIDOCAINE HCL (PF) 1 % IJ SOLN
10.0000 mL | Freq: Once | INTRAMUSCULAR | Status: AC
  Administered 2016-12-14: 15:00:00

## 2016-12-14 NOTE — Progress Notes (Signed)
Safety precautions to be maintained throughout the outpatient stay will include: orient to surroundings, keep bed in low position, maintain call bell within reach at all times, provide assistance with transfer out of bed and ambulation.  

## 2016-12-14 NOTE — Progress Notes (Addendum)
Patient's Name: Caleb Warren  MRN: [id number]  Referring Provider: Glori Luis, MD  DOB: [birth year]  PCP: Glori Luis, MD  DOS: 12/14/2016  Note by: Sydnee Levans. Laban Emperor, MD  Service setting: Ambulatory outpatient  Specialty: Interventional Pain Management  Location: [facility] (AMB) Pain Management Facility    Patient type: New Patient   Primary Reason(s) for Visit: Initial Patient Evaluation CC: Hand Pain (both , left is worse)  HPI  Caleb Warren is a 56 y.o. year old, male patient, who comes today for an initial evaluation. He has Diabetes mellitus type 2, controlled (HCC); Hypertension; Hyperlipidemia; Low testosterone; Shift work sleep disorder; Obesity (BMI 30-39.9); Lipid screening; Routine general medical examination at a health care facility; Left hand pain; Sesamoiditis (Left); Arthritis associated with diabetes (HCC); and Bilateral hand pain on his problem list.. His primarily concern today is the Hand Pain (both , left is worse)  Pain Assessment: Self-Reported Pain Score: 5 /10             Reported level is compatible with observation.       Pain Type: Chronic pain Pain Location: Hand Pain Orientation: Right, Left (left is worse) Pain Descriptors / Indicators: Aching, Shooting, Constant, Sharp Pain Frequency: Constant  Onset and Duration: Gradual and Present longer than 3 months Cause of pain: Unknown Severity: Getting worse, NAS-11 at its worse: 6/10, NAS-11 at its best: 1/10, NAS-11 now: 4/10 and NAS-11 on the average: 4/10 Timing: Morning and During activity or exercise Aggravating Factors: Motion Alleviating Factors: Hot packs Associated Problems: Swelling and Weakness Quality of Pain: Aching, Annoying, Constant, Deep, Distressing, Sharp, Shooting and Uncomfortable Previous Examinations or Tests: The patient denies treatment Previous Treatments: The patient denies treatment  The patient comes into the clinics today for the first time for a chronic pain management  evaluation. Bilateral hand pain with most of the pain being in the left hand over the sesamoid bones of the thumb.  Historic Controlled Substance Pharmacotherapy Review  N/A  Meds  The patient has a current medication list which includes the following prescription(s): aspirin, atorvastatin, magnesium, metformin, omeprazole, and meloxicam.  Current Outpatient Prescriptions on File Prior to Visit  Medication Sig  . aspirin 81 MG tablet Take 81 mg by mouth daily.  Marland Kitchen atorvastatin (LIPITOR) 40 MG tablet Take 1 tablet (40 mg total) by mouth daily.  . Magnesium 100 MG CAPS Take by mouth.  . metFORMIN (GLUCOPHAGE) 500 MG tablet Take 1 tablet (500 mg total) by mouth 2 (two) times daily with a meal.  . omeprazole (PRILOSEC) 20 MG capsule Take 1 capsule (20 mg total) by mouth as needed.   No current facility-administered medications on file prior to visit.    Imaging Review   Note: No results found under the [facility] electronic medical record.        ROS  Cardiovascular History: Daily Aspirin intake Pulmonary or Respiratory History: Negative for bronchial asthma, emphysema, chronic smoking, chronic bronchitis, sarcoidosis, tuberculosis or sleep apena Neurological History: Negative for epilepsy, stroke, urinary or fecal inontinence, spina bifida or tethered cord syndrome Review of Past Neurological Studies: No results found for this or any previous visit. Psychological-Psychiatric History: Negative for anxiety, depression, schizophrenia, bipolar disorders or suicidal ideations or attempts Gastrointestinal History: Reflux or heatburn Genitourinary History: Nephrolithiasis Hematological History: Negative for anticoagulant therapy, anemia, bruising or bleeding easily, hemophilia, sickle cell disease or trait, thrombocytopenia or coagulupathies Endocrine History: Negative for diabetes or thyroid disease Rheumatologic History: Negative for lupus, osteoarthritis, rheumatoid  arthritis, myositis,  polymyositis or fibromyagia Musculoskeletal History: Negative for myasthenia gravis, muscular dystrophy, multiple sclerosis or malignant hyperthermia Work History: Working full time  Allergies  Caleb Warren has No Known Allergies.  Laboratory Chemistry  Inflammation Markers No results found for: ESRSEDRATE, CRP Renal Function Lab Results  Component Value Date   BUN 11 08/03/2016   CREATININE 0.95 08/03/2016   GFRAA 106 08/03/2016   GFRNONAA 92 08/03/2016   Hepatic Function Lab Results  Component Value Date   AST 24 08/03/2016   ALT 35 08/03/2016   ALBUMIN 4.4 08/03/2016   Electrolytes Lab Results  Component Value Date   NA 144 08/03/2016   K 4.4 08/03/2016   CL 102 08/03/2016   CALCIUM 8.7 08/03/2016   Pain Modulating Vitamins No results found for: Jerry Caras [id number], [id number], [id number], 25OHVITD1, 25OHVITD2, 25OHVITD3, VITAMINB12 Coagulation Parameters Lab Results  Component Value Date   PLT 196 08/03/2016   Cardiovascular Lab Results  Component Value Date   HGB 15.5 01/12/2015   HCT 45.1 08/03/2016   Note: Lab results reviewed.  PFSH  Drug: Caleb Warren  reports that he does not use drugs. Alcohol:  reports that he drinks alcohol. Tobacco:  reports that he has never smoked. He has never used smokeless tobacco. Medical:  has a past medical history of Chicken pox; Diverticulitis; GERD (gastroesophageal reflux disease); Hyperlipidemia; Hypertension; and Kidney stone. Family: family history includes Arthritis in his maternal grandfather and maternal grandmother; Cancer in his maternal grandmother and paternal uncle; Hyperlipidemia in his father and mother; Hypertension in his father and mother.  Past Surgical History:  Procedure Laterality Date  . KIDNEY STONE SURGERY  1994   Active Ambulatory Problems    Diagnosis Date Noted  . Diabetes mellitus type 2, controlled (HCC) 05/25/2012  . Hypertension 05/25/2012  . Hyperlipidemia 05/25/2012  . Low testosterone  07/12/2012  . 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 Obesity (BMI 30-39.9) 05/20/2014  . Lipid screening 05/20/2014  . Routine general medical examination at a health care facility 07/21/2016  . Left hand pain 12/14/2016  . Sesamoiditis (Left) 12/14/2016  . Arthritis associated with diabetes (HCC) 12/15/2016  . Bilateral hand pain 12/15/2016   Resolved Ambulatory Problems    Diagnosis Date Noted  . Fatigue 05/25/2012  . Obesity (BMI 30-39.9) 05/25/2012  . Skin lesion of scalp 07/12/2012  . Other malaise and fatigue 03/07/2013  . Decreased libido 07/24/2013  . Overweight (BMI 25.0-29.9) 12/12/2013  . Acute nonsuppurative otitis media of right ear 05/20/2014   Past Medical History:  Diagnosis Date  . Chicken pox   . Diverticulitis   . GERD (gastroesophageal reflux disease)   . Hyperlipidemia   . Hypertension   . Kidney stone    Constitutional Exam  General appearance: Well nourished, well developed, and well hydrated. In no apparent acute distress Vitals:   12/14/16 1429 12/14/16 1500 12/14/16 1507 12/14/16 1511  BP: (!) 142/85 130/80 138/76 122/82  Pulse: 75 84 78 77  Resp: 16 16 16 16   Temp: 97.9 F (36.6 C)     TempSrc: Oral     SpO2: 97% 99% 98% 98%  Weight: 200 lb (90.7 kg)     Height: 5\' 6"  (1.676 m)      BMI Assessment: Estimated body mass index is 32.28 kg/m as calculated from the following:   Height as of this encounter: 5\' 6"  (1.676 m).   Weight as of this encounter: 200 lb (90.7 kg).  BMI interpretation table: BMI level Category Range  association with higher incidence of chronic pain  <18 kg/m2 Underweight   18.5-24.9 kg/m2 Ideal body weight   25-29.9 kg/m2 Overweight Increased incidence by 20%  30-34.9 kg/m2 Obese (Class I) Increased incidence by 68%  35-39.9 kg/m2 Severe obesity (Class II) Increased incidence by 136%  >40 kg/m2 Extreme obesity (Class III) Increased incidence by 254%   BMI Readings from Last 4 Encounters:  12/14/16 32.28 kg/m   07/21/16 35.21 kg/m  01/16/15 33.12 kg/m  11/12/14 32.49 kg/m   Wt Readings from Last 4 Encounters:  12/14/16 200 lb (90.7 kg)  07/21/16 211 lb 9.6 oz (96 kg)  01/16/15 199 lb (90.3 kg)  11/12/14 195 lb 4 oz (88.6 kg)  Psych/Mental status: Alert, oriented x 3 (person, place, & time)       Eyes: PERLA Respiratory: No evidence of acute respiratory distress  Upper Extremity (UE) Exam    Side: Right upper extremity  Side: Left upper extremity  Inspection: No masses, redness, swelling, or asymmetry  Inspection: No masses, redness, swelling, or asymmetry  Functional ROM: Unrestricted ROM          Functional ROM: Unrestricted ROM          Muscle strength & Tone: Functionally intact  Muscle strength & Tone: Functionally intact  Sensory: Unimpaired  Sensory: Articular pain pattern  Palpation: Non-contributory  Palpation: Complains of area being tender to palpation over the sesamoid bones of the thumb    Assessment  Primary Diagnosis & Pertinent Problem List: The primary encounter diagnosis was Left hand pain. Diagnoses of Sesamoiditis (Left), Arthritis associated with diabetes (HCC), and Bilateral hand pain were also pertinent to this visit.  Visit Diagnosis: 1. Left hand pain   2. Sesamoiditis (Left)   3. Arthritis associated with diabetes (HCC)   4. Bilateral hand pain    Plan of Care  Initial treatment plan:  Please be advised that as per protocol, today's visit has been an evaluation only. We have not taken over the patient's controlled substance management.  Problem-specific plan: No problem-specific Assessment & Plan notes found for this encounter.  Ordered Lab-work, Procedure(s), Referral(s), & Consult(s): Orders Placed This Encounter  Procedures  . Injection tendon or ligament  . DG Hand Complete Left  . DG Hand Complete Right  . C-reactive protein  . Sedimentation rate  . Uric acid, random urine  . Uric acid   Pharmacotherapy: Medications ordered:  Meds ordered  this encounter  Medications  . ropivacaine (PF) 2 mg/mL (0.2%) (NAROPIN) 2 MG/ML injection    [place]ay, KORI: cabinet override  . lidocaine (PF) (XYLOCAINE) 1 % injection    Tibbetts, KORI: cabinet override  . methylPREDNISolone acetate (DEPO-MEDROL) 80 MG/ML injection    Berhow, KORI: cabinet override  . lidocaine (PF) (XYLOCAINE) 1 % injection 10 mL  . ropivacaine (PF) 2 mg/mL (0.2%) (NAROPIN) injection 4 mL  . methylPREDNISolone acetate (DEPO-MEDROL) injection 80 mg  . meloxicam (MOBIC) 15 MG tablet    Sig: Take 1 tablet (15 mg total) by mouth daily.    Dispense:  30 tablet    Refill:  0    Do not place this medication, or any other prescription from our practice, on "Automatic Refill". Patient may have prescription filled one day early if pharmacy is closed on scheduled refill date.   Medications administered during this visit: We administered ropivacaine (PF) 2 mg/mL (0.2%), lidocaine (PF), methylPREDNISolone acetate, lidocaine (PF), ropivacaine (PF) 2 mg/mL (0.2%), and methylPREDNISolone acetate.   Pharmacotherapy under consideration:  N/A  Interventional therapies under consideration: Procedure: Left thumb sesamoid injection    Procedure:  Anesthesia, Analgesia, Anxiolysis:  Type: Ligament/Tendon sheath (16109) Injection. Level: Sesamoid bones of the thumb Laterality: Left-sided Position: Sitting Target Area: Sesamoid bones of thumb Approach: Percutanous Region: Left thumb area Primary Purpose: Diagnostic  Type: Local Anesthesia Local Anesthetic: Lidocaine 1% Route: Infiltration (Firth/IM) IV Access: Declined Sedation: Declined  Indication(s): Analgesia          Indications: 1. Left hand pain   2. Sesamoiditis (Left)    Pain Score: Pre-procedure: 5 /10 Post-procedure: 2 /10  Pre-op Assessment:  Previous date of service:   Service provided: Evaluation, Procedure Mr. Selmer is a 56 y.o. (year old), male patient, seen today for interventional treatment. He  has a past surgical  history that includes Kidney stone surgery (1994). His primarily concern today is the Hand Pain (both , left is worse)  Initial Vital Signs: Blood pressure 122/82, pulse 77, temperature 97.9 F (36.6 C), temperature source Oral, resp. rate 16, height 5\' 6"  (1.676 m), weight 200 lb (90.7 kg), SpO2 98 %. BMI: 32.28 kg/m  Risk Assessment: Allergies: Reviewed. He has No Known Allergies. Allergy Precautions: None required Coagulopathies: "Reviewed. None identified.  Blood-thinner therapy: None at this time Active Infection(s): Reviewed. None identified. Mr. Betts is afebrile  Site Confirmation: Mr. Godwin was asked to confirm the procedure and laterality before marking the site Procedure checklist: Completed Consent: Before the procedure and under the influence of no sedative(s), amnesic(s), or anxiolytics, the patient was informed of the treatment options, risks and possible complications. To fulfill our ethical and legal obligations, as recommended by the American Medical Association's Code of Ethics, I have informed the patient of my clinical impression; the nature and purpose of the treatment or procedure; the risks, benefits, and possible complications of the intervention; the alternatives, including doing nothing; the risk(s) and benefit(s) of the alternative treatment(s) or procedure(s); and the risk(s) and benefit(s) of doing nothing. The patient was provided information about the general risks and possible complications associated with the procedure. These may include, but are not limited to: failure to achieve desired goals, infection, bleeding, organ or nerve damage, allergic reactions, paralysis, and death. In addition, the patient was informed of those risks and complications associated to the procedure, such as failure to decrease pain; infection; bleeding; organ or nerve damage with subsequent damage to sensory, motor, and/or autonomic systems, resulting in permanent pain, numbness, and/or  weakness of one or several areas of the body; allergic reactions; (i.e.: anaphylactic reaction); and/or death. Furthermore, the patient was informed of those risks and complications associated with the medications. These include, but are not limited to: allergic reactions (i.e.: anaphylactic or anaphylactoid reaction(s)); adrenal axis suppression; blood sugar elevation that in diabetics may result in ketoacidosis or comma; water retention that in patients with history of congestive heart failure may result in shortness of breath, pulmonary edema, and decompensation with resultant heart failure; weight gain; swelling or edema; medication-induced neural toxicity; particulate matter embolism and blood vessel occlusion with resultant organ, and/or nervous system infarction; and/or aseptic necrosis of one or more joints. Finally, the patient was informed that Medicine is not an exact science; therefore, there is also the possibility of unforeseen or unpredictable risks and/or possible complications that may result in a catastrophic outcome. The patient indicated having understood very clearly. We have given the patient no guarantees and we have made no promises. Enough time was given to the patient to ask questions, all of which were  answered to the patient's satisfaction. Mr. Rosamaria Lintsice has indicated that he wanted to continue with the procedure. Attestation: I, the ordering provider, attest that I have discussed with the patient the benefits, risks, side-effects, alternatives, likelihood of achieving goals, and potential problems during recovery for the procedure that I have provided informed consent. Date: 12/14/2016; Time: 4:29 PM  Pre-Procedure Preparation:  Monitoring: Blood pressure, oxygen saturation, and clinical observation Safety Precautions: Patient was assessed for positional comfort and pressure points before starting the procedure. Time-out: I initiated and conducted the "Time-out" before starting the  procedure, as per protocol. The patient was asked to participate by confirming the accuracy of the "Time Out" information. Verification of the correct person, site, and procedure were performed and confirmed by me, the nursing staff, and the patient. "Time-out" conducted as per Joint Commission's Universal Protocol (UP.01.01.01). "Time-out" Date & Time: 12/14/2016; 1503 hrs.  Description of Procedure Process:   Area Prepped: Entire left hand Prepping solution: ChloraPrep (2% chlorhexidine gluconate and 70% isopropyl alcohol) Safety Precautions: Aspiration looking for blood return was conducted prior to all injections. At no point did we inject any substances, as a needle was being advanced. No attempts were made at seeking any paresthesias. Safe injection practices and needle disposal techniques used. Medications properly checked for expiration dates. SDV (single dose vial) medications used. Description of the Procedure: Protocol guidelines were followed. The patient was placed in position. The target area was identified and prepped in the usual manner. Skin & deeper tissues infiltrated with local anesthetic. Appropriate time provided for local anesthetics to take effect. The procedure needle was slowly advanced to target area. Proper needle placement secured. Negative aspiration confirmed. Solution injected in intermittent fashion, asking for systemic symptoms every 0.5cc. Needle(s) removed and area cleaned, making sure to leave some prepping solution back to take advantage of its long term bactericidal properties. Start Time: 1503 hrs. End Time: 1507 hrs. Materials:  Needle(s) Type: Regular needle Gauge: 25G Length: 1.5-in Medication(s): We administered ropivacaine (PF) 2 mg/mL (0.2%), lidocaine (PF), methylPREDNISolone acetate, lidocaine (PF), ropivacaine (PF) 2 mg/mL (0.2%), and methylPREDNISolone acetate. Please see chart orders for dosing details.  Imaging Guidance:  Type of Imaging Technique:  None used Indication(s): N/A Exposure Time: No patient exposure Contrast: None used. Fluoroscopic Guidance: N/A Ultrasound Guidance: N/A Interpretation: N/A  Post-operative Assessment:  EBL: None Complications: No immediate post-treatment complications observed by team, or reported by patient. Note: The patient tolerated the entire procedure well. A repeat set of vitals were taken after the procedure and the patient was kept under observation following institutional policy, for this type of procedure. Post-procedural neurological assessment was performed, showing return to baseline, prior to discharge. The patient was provided with post-procedure discharge instructions, including a section on how to identify potential problems. Should any problems arise concerning this procedure, the patient was given instructions to immediately contact us, at any time, without hesitation. In any case, we plan to contact the patient by telephone for a follow-up status report regarding this interventional procedure. Comments:  No additional relevant information.  Plan of Care  Disposition: Discharge home  Discharge Date & Time: 12/14/2016; 1515 hrs.  Physician-requested Follow-up:  Return in about 2 weeks (around 12/28/2016) for Post-Procedure evaluation.  No future appointments. Medications ordered for procedure: Meds ordered this encounter  Medications  . ropivacaine (PF) 2 mg/mL (0.2%) (NAROPIN) 2 MG/ML injection    Swift, KORI: cabinet override  . lidocaine (PF) (XYLOCAINE) 1 % injection    Hair, KORI: cabinet override  .  methylPREDNISolone acetate (DEPO-MEDROL) 80 MG/ML injection    Velazquez, KORI: cabinet override  . lidocaine (PF) (XYLOCAINE) 1 % injection 10 mL  . ropivacaine (PF) 2 mg/mL (0.2%) (NAROPIN) injection 4 mL  . methylPREDNISolone acetate (DEPO-MEDROL) injection 80 mg   Medications administered: We administered ropivacaine (PF) 2 mg/mL (0.2%), lidocaine (PF), methylPREDNISolone acetate,  lidocaine (PF), ropivacaine (PF) 2 mg/mL (0.2%), and methylPREDNISolone acetate.  See the medical record for exact dosing, route, and time of administration.  Lab-work, Procedure(s), & Referral(s) Ordered: Orders Placed This Encounter  Procedures  . Injection tendon or ligament   Imaging Ordered: No results found for this or any previous visit. New Prescriptions   No medications on file   Primary Care Physician: Glori Luis, MD Location: [facility] Note by: Sydnee Levans. Laban Emperor, M.D, DABA, DABAPM, DABPM, DABIPP, FIPP Date: 12/14/2016; Time: 4:29 PM  Disclaimer:  Medicine is not an Visual merchandiser. The only guarantee in medicine is that nothing is guaranteed. It is important to note that the decision to proceed with this intervention was based on the information collected from the patient. The Data and conclusions were drawn from the patient's questionnaire, the interview, and the physical examination. Because the information was provided in large part by the patient, it cannot be guaranteed that it has not been purposely or unconsciously manipulated. Every effort has been made to obtain as much relevant data as possible for this evaluation. It is important to note that the conclusions that lead to this procedure are derived in large part from the available data. Always take into account that the treatment will also be dependent on availability of resources and existing treatment guidelines, considered by other Pain Management Practitioners as being common knowledge and practice, at the time of the intervention. For Medico-Legal purposes, it is also important to point out that variation in procedural techniques and pharmacological choices are the acceptable norm. The indications, contraindications, technique, and results of the above procedure should only be interpreted and judged by a Board-Certified Interventional Pain Specialist with extensive familiarity and  expertise in the same exact procedure and technique. Attempts at providing opinions without similar or greater experience and expertise than that of the treating physician will be considered as inappropriate and unethical, and shall result in a formal complaint to the state medical board and applicable specialty societies.  Instructions provided at this appointment: There are no Patient Instructions on file for this visit.

## 2016-12-15 DIAGNOSIS — E11618 Type 2 diabetes mellitus with other diabetic arthropathy: Secondary | ICD-10-CM | POA: Insufficient documentation

## 2016-12-15 DIAGNOSIS — M79642 Pain in left hand: Secondary | ICD-10-CM

## 2016-12-15 DIAGNOSIS — M79641 Pain in right hand: Secondary | ICD-10-CM | POA: Insufficient documentation

## 2016-12-15 MED ORDER — MELOXICAM 15 MG PO TABS: 15.0000 mg | ORAL_TABLET | Freq: Every day | ORAL | 0 refills | Status: DC

## 2016-12-15 NOTE — Addendum Note (Signed)
Addended by: Delano MetzNAVEIRA, Chalmers Iddings A on: 12/15/2016 04:03 PM   Modules accepted: Orders

## 2016-12-16 ENCOUNTER — Ambulatory Visit
Admission: RE | Admit: 2016-12-16 | Discharge: 2016-12-16 | Disposition: A | Discharge status: Home / Self Care | Payer: Managed Care, Other (non HMO) | Source: Ambulatory Visit | Attending: Pain Medicine | Admitting: Pain Medicine

## 2016-12-16 ENCOUNTER — Other Ambulatory Visit
Admission: RE | Admit: 2016-12-16 | Discharge: 2016-12-16 | Disposition: A | Discharge status: Home / Self Care | Payer: Managed Care, Other (non HMO) | Source: Ambulatory Visit | Attending: Pain Medicine | Admitting: Pain Medicine

## 2016-12-16 DIAGNOSIS — M79641 Pain in right hand: Secondary | ICD-10-CM | POA: Diagnosis present

## 2016-12-16 DIAGNOSIS — M79642 Pain in left hand: Secondary | ICD-10-CM | POA: Diagnosis present

## 2016-12-16 DIAGNOSIS — M19041 Primary osteoarthritis, right hand: Secondary | ICD-10-CM | POA: Insufficient documentation

## 2016-12-16 DIAGNOSIS — E11618 Type 2 diabetes mellitus with other diabetic arthropathy: Secondary | ICD-10-CM

## 2016-12-16 LAB — C-REACTIVE PROTEIN: CRP: 1 mg/dL — AB (ref <1.0)

## 2016-12-16 LAB — SEDIMENTATION RATE: Sed Rate: 9 mm/hr (ref 0–20)

## 2016-12-16 LAB — URIC ACID: URIC ACID, SERUM: 5.6 mg/dL (ref 4.4–7.6)

## 2016-12-17 LAB — URIC ACID, RANDOM URINE: URIC ACID, URINE: 59.1 mg/dL

## 2016-12-19 ENCOUNTER — Other Ambulatory Visit: Payer: Self-pay

## 2017-01-13 ENCOUNTER — Ambulatory Visit: Payer: Self-pay | Admitting: Physician Assistant

## 2017-01-13 ENCOUNTER — Encounter: Payer: Self-pay | Admitting: Physician Assistant

## 2017-01-13 VITALS — BP 110/75 | HR 65 | Temp 97.7°F

## 2017-01-13 DIAGNOSIS — N200 Calculus of kidney: Secondary | ICD-10-CM

## 2017-01-13 LAB — POCT URINALYSIS DIPSTICK
Glucose, UA: NEGATIVE
Leukocytes, UA: NEGATIVE
Nitrite, UA: NEGATIVE
PH UA: 5.5
Urobilinogen, UA: 0.2

## 2017-01-13 NOTE — Addendum Note (Signed)
Addended by: Catha BrowEACON, Kisean Rollo T on: 01/13/2017 01:03 PM   Modules accepted: Orders

## 2017-01-13 NOTE — Addendum Note (Signed)
Addended by: Catha BrowEACON, MONIQUE T on: 01/13/2017 12:55 PM   Modules accepted: Orders

## 2017-01-13 NOTE — Progress Notes (Signed)
   Subjective:Kidney stone    Patient ID: Caleb Warren, male    DOB: 12/28/1960, 56 y.o.   MRN: [id number]  HPI Patient c/o right flank pain for 2 days. History of Kidney stones. Last episode was in [id number]. Patient was informed he had a non-passable stone in lower Kidney which would later cause problem. Denies fever.   Review of Systems DM    Objective:   Physical Exam Mild distress. Dip UA 3+ blood, 1+ Bil, and 2+ protein.       Assessment & Plan:Renal Stone.  Patient will be consult to Urologist. Give  Prescriptions for Percocets. Report to ED if condition worsen before Urologist's appointment.

## 2017-01-17 NOTE — Progress Notes (Signed)
Patient has been referred to [facility] on 01/18/2017 at 1:15.  Patient has been notified and has accepted the appointment.

## 2017-01-17 NOTE — Addendum Note (Signed)
Addended by: Catha BrowEACON, Aquarius Latouche T on: 01/17/2017 01:47 PM   Modules accepted: Orders

## 2017-01-18 ENCOUNTER — Encounter: Payer: Self-pay | Admitting: Urology

## 2017-01-18 ENCOUNTER — Ambulatory Visit (INDEPENDENT_AMBULATORY_CARE_PROVIDER_SITE_OTHER): Payer: Managed Care, Other (non HMO) | Admitting: Urology

## 2017-01-18 VITALS — BP 148/89 | HR 73 | Ht 66.0 in | Wt 184.0 lb

## 2017-01-18 DIAGNOSIS — Z87442 Personal history of urinary calculi: Secondary | ICD-10-CM | POA: Diagnosis not present

## 2017-01-18 DIAGNOSIS — R310 Gross hematuria: Secondary | ICD-10-CM

## 2017-01-18 DIAGNOSIS — R109 Unspecified abdominal pain: Secondary | ICD-10-CM | POA: Diagnosis not present

## 2017-01-18 LAB — URINALYSIS, COMPLETE
BILIRUBIN UA: NEGATIVE
KETONES UA: NEGATIVE
LEUKOCYTES UA: NEGATIVE
Nitrite, UA: NEGATIVE
PROTEIN UA: NEGATIVE
RBC, UA: NEGATIVE
SPEC GRAV UA: 1.015 (ref 1.005–1.030)
Urobilinogen, Ur: 0.2 mg/dL (ref 0.2–1.0)
pH, UA: 5 (ref 5.0–7.5)

## 2017-01-18 LAB — MICROSCOPIC EXAMINATION: Bacteria, UA: NONE SEEN

## 2017-01-18 NOTE — Progress Notes (Signed)
01/18/2017 1:50 PM   Caleb Warren [birth year] [id number]  Referring provider: Glori Luis, MD [address] [place], [place] [address]  Chief Complaint  Patient presents with  . New Patient (Initial Visit)    Kidney stone    HPI: Patient is a 56 year old Caucasian male who presents today as a referral from Dr. Birdie Sons for kidney stones.   For the last month, patient has been experiencing right upper quadrant pain that radiates to the right groin area.  He has seeing blood in his urine from time to time.  Pain 2-3/10.  Nothing helps the pain.  Nothing makes the pain worse.  He has a history of kidney stones.  He states that his stone was cystine in composition.  He passes his stones spontaneously.  Had a metabolic work up years ago.    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    He has not had any recent imaging.    He has not seen an urologist in a number of years.  His last urologist was in [place].    He is a former smoker.  Quit in 1990.  Pack a day smoker.  No second hand smoke exposure.  He was in the [employer].    No GU cancers in the family.     PMH: Past Medical History:  Diagnosis Date  . Chicken pox   . Diabetes (HCC)   . Diverticulitis   . GERD (gastroesophageal reflux disease)   . Hyperlipidemia   . Hypertension   . Kidney stone     Surgical History: Past Surgical History:  Procedure Laterality Date  . KIDNEY STONE SURGERY  1994    Home Medications:  Allergies as of 01/18/2017   No Known Allergies     Medication List       Accurate as of 01/18/17  1:50 PM. Always use your most recent med list.          aspirin EC 81 MG tablet Take by mouth.   aspirin 81 MG tablet Take 81 mg by mouth daily.   atorvastatin 40 MG tablet Commonly known as:  LIPITOR Take 1 tablet (40 mg total) by mouth daily.   lisinopril 20 MG tablet Commonly known as:  PRINIVIL,ZESTRIL Take by mouth.   Magnesium 100 MG  Caps Take by mouth.   magnesium oxide 400 MG tablet Commonly known as:  MAG-OX Take by mouth.   meloxicam 15 MG tablet Commonly known as:  MOBIC Take 1 tablet (15 mg total) by mouth daily.   metFORMIN 500 MG tablet Commonly known as:  GLUCOPHAGE Take 1 tablet (500 mg total) by mouth 2 (two) times daily with a meal.   omeprazole 20 MG capsule Commonly known as:  PRILOSEC Take 1 capsule (20 mg total) by mouth as needed.   phentermine 37.5 MG capsule Take by mouth.   simvastatin 20 MG tablet Commonly known as:  ZOCOR Take by mouth.   Vitamin D3 2000 units capsule Take by mouth.       Allergies: No Known Allergies  Family History: Family History  Problem Relation Age of Onset  . Hyperlipidemia Mother   . Hypertension Mother   . Hyperlipidemia Father   . Hypertension Father   . Arthritis Maternal Grandmother   . Cancer Maternal Grandmother     breast  . Arthritis Maternal Grandfather   . Cancer Paternal Uncle     colon  .  Prostate cancer Neg Hx   . Kidney cancer Neg Hx   . Bladder Cancer Neg Hx     Social History:  reports that he quit smoking about 28 years ago. He has never used smokeless tobacco. He reports that he drinks alcohol. He reports that he does not use drugs.  ROS: UROLOGY Frequent Urination?: No Hard to postpone urination?: No Burning/pain with urination?: No Get up at night to urinate?: No Leakage of urine?: No Urine stream starts and stops?: No Trouble starting stream?: No Do you have to strain to urinate?: No Blood in urine?: Yes Urinary tract infection?: No Sexually transmitted disease?: No Injury to kidneys or bladder?: No Painful intercourse?: No Weak stream?: No Erection problems?: No Penile pain?: No  Gastrointestinal Nausea?: No Vomiting?: No Indigestion/heartburn?: No Diarrhea?: No Constipation?: No  Constitutional Fever: No Night sweats?: No Weight loss?: No Fatigue?: No  Skin Skin rash/lesions?: No Itching?:  No  Eyes Blurred vision?: No Double vision?: No  Ears/Nose/Throat Sore throat?: No Sinus problems?: No  Hematologic/Lymphatic Swollen glands?: No Easy bruising?: No  Cardiovascular Leg swelling?: No Chest pain?: No  Respiratory Cough?: No Shortness of breath?: No  Endocrine Excessive thirst?: No  Musculoskeletal Back pain?: No Joint pain?: No  Neurological Headaches?: No Dizziness?: No  Psychologic Depression?: No Anxiety?: No  Physical Exam: BP (!) 148/89   Pulse 73   Ht 5\' 6"  (1.676 m)   Wt 184 lb (83.5 kg)   BMI 29.70 kg/m   Constitutional: Well nourished. Alert and oriented, No acute distress. HEENT: [place] AT, moist mucus membranes. Trachea midline, no masses. Cardiovascular: No clubbing, cyanosis, or edema. Respiratory: Normal respiratory effort, no increased work of breathing. GI: Abdomen is soft, non tender, non distended, no abdominal masses. Liver and spleen not palpable.  No hernias appreciated.  Stool sample for occult testing is not indicated.   GU: No CVA tenderness.  No bladder fullness or masses.  Genital and rectal deferred by patient.  Recently examined by PCP.  Skin: No rashes, bruises or suspicious lesions. Lymph: No cervical or inguinal adenopathy. Neurologic: Grossly intact, no focal deficits, moving all 4 extremities. Psychiatric: Normal mood and affect.  Laboratory Data: Lab Results  Component Value Date   WBC 6.4 08/03/2016   HGB 15.5 01/12/2015   HCT 45.1 08/03/2016   MCV 89 08/03/2016   PLT 196 08/03/2016    Lab Results  Component Value Date   CREATININE 0.95 08/03/2016     Lab Results  Component Value Date   HGBA1C 8.1 (H) 08/03/2016    Lab Results  Component Value Date   TSH 2.780 08/03/2016       Component Value Date/Time   CHOL 234 (H) 08/03/2016 0800   HDL 32 (L) 08/03/2016 0800   CHOLHDL 7.3 (H) 08/03/2016 0800   LDLCALC Comment 08/03/2016 0800    Lab Results  Component Value Date   AST 24  08/03/2016   Lab Results  Component Value Date   ALT 35 08/03/2016    Urinalysis Unremarkable.  See EPIC.    Assessment & Plan:    1. Gross hematuria  - I explained to the patient that there are a number of causes that can be associated with blood in the urine, such as stones,  BPH, UTI's, damage to the urinary tract and/or cancer.  - At this time, I felt that the patient warranted further urologic evaluation.   The AUA guidelines state that a CT urogram is the preferred imaging study to  evaluate hematuria.  - I explained to the patient that a contrast material will be injected into a vein and that in rare instances, an allergic reaction can result and may even life threatening   The patient denies any allergies to contrast, iodine and/or seafood and is taking metformin.  - The patient had the opportunity to ask questions which were answered. Based upon this discussion, the patient is willing to proceed. Therefore, I've ordered: a CT Urogram   - The patient will return following all of the above for discussion of the results.   - UA  - Urine culture  - BUN + creatinine    - Advised to contact our office or seek treatment in the ED if becomes febrile or pain/ vomiting are difficult control in order to arrange for emergent/urgent intervention  2. Right flank pain  - CT Urogram pending  3. History of nephrolithiasis  - CT Urogram pending    Return for CT Urogram report .  These notes generated with voice recognition software. I apologize for typographical errors.  Michiel CowboySHANNON Kemani Heidel, PA-C  [facility] [address], [address] [place], [place] [id number] [phone]

## 2017-01-19 ENCOUNTER — Other Ambulatory Visit: Payer: Self-pay

## 2017-01-19 DIAGNOSIS — E11618 Type 2 diabetes mellitus with other diabetic arthropathy: Secondary | ICD-10-CM

## 2017-01-19 MED ORDER — MELOXICAM 15 MG PO TABS: 15.0000 mg | ORAL_TABLET | Freq: Every day | ORAL | 99 refills | Status: DC

## 2017-01-20 LAB — CULTURE, URINE COMPREHENSIVE

## 2017-01-26 ENCOUNTER — Ambulatory Visit
Admission: RE | Admit: 2017-01-26 | Discharge: 2017-01-26 | Disposition: A | Discharge status: Home / Self Care | Payer: Managed Care, Other (non HMO) | Source: Ambulatory Visit | Attending: Urology | Admitting: Urology

## 2017-01-26 DIAGNOSIS — I700 Atherosclerosis of aorta: Secondary | ICD-10-CM | POA: Insufficient documentation

## 2017-01-26 DIAGNOSIS — R310 Gross hematuria: Secondary | ICD-10-CM | POA: Diagnosis present

## 2017-01-26 DIAGNOSIS — N200 Calculus of kidney: Secondary | ICD-10-CM | POA: Insufficient documentation

## 2017-01-26 LAB — POCT I-STAT CREATININE: CREATININE: 0.9 mg/dL (ref 0.61–1.24)

## 2017-01-26 MED ORDER — IOPAMIDOL (ISOVUE-300) INJECTION 61%
125.0000 mL | Freq: Once | INTRAVENOUS | Status: AC | PRN
  Administered 2017-01-26: 125 mL via INTRAVENOUS

## 2017-02-12 NOTE — Progress Notes (Signed)
  02/13/2017 10:10 AM   Caleb Warren 03/08/1961 [id number]  Referring provider: Eric G Sonnenberg, MD [address] [place], [place] [address]  Chief Complaint  Patient presents with  . Hematuria    HPI: 55 yo WM who presents today to discuss CT urogram results performed to evaluate right flank pain associated with gross hematuria.  Background history Patient was a referral from Dr. Sonnenberg for kidney stones.   For the last month, patient has been experiencing right upper quadrant pain that radiates to the right groin area.  He has seeing blood in his urine from time to time.  Pain 2-3/10.  Nothing helps the pain.  Nothing makes the pain worse.  He has a history of kidney stones.  He states that his stone was cystine in composition.  He passes his stones spontaneously.  Had a metabolic work up years ago.  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   He has not had any recent imaging.  He has not seen an urologist in a number of years.  His last urologist was in [place].  He is a former smoker.  Quit in 1990.  Pack a day smoker.  No second hand smoke exposure.  He was in the military.  No GU cancers in the family.    CT urogram performed on 01/26/2017 noted bilateral nephrolithiasis including a dominant 1.6 cm nonobstructing stone in the right renal pelvis. There is some mild peripelvic edema associated. No ureteral or bladder stones. Otherwise no acute findings in the abdomen or pelvis.  Specifically, the terminal ileum and the appendix are normal.  Abdominal Aortic Atherosclerois (ICD10-170.0).  I have independently reviewed the films.  Today, he is still having right flank discomfort.  He states it can reach levels of 5/10.  He is not having fevers, chills,    PMH: Past Medical History:  Diagnosis Date  . Chicken pox   . Diabetes (HCC)   . Diverticulitis   . GERD (gastroesophageal reflux disease)   . Hyperlipidemia   . Hypertension   .  Kidney stone     Surgical History: Past Surgical History:  Procedure Laterality Date  . KIDNEY STONE SURGERY  1994    Home Medications:  Allergies as of 02/13/2017   No Known Allergies     Medication List       Accurate as of 02/13/17 10:10 AM. Always use your most recent med list.          aspirin 81 MG tablet Take 81 mg by mouth daily.   atorvastatin 40 MG tablet Commonly known as:  LIPITOR Take 1 tablet (40 mg total) by mouth daily.   lisinopril 20 MG tablet Commonly known as:  PRINIVIL,ZESTRIL Take by mouth.   meloxicam 15 MG tablet Commonly known as:  MOBIC Take 1 tablet (15 mg total) by mouth daily.   metFORMIN 500 MG tablet Commonly known as:  GLUCOPHAGE Take 1 tablet (500 mg total) by mouth 2 (two) times daily with a meal.   omeprazole 20 MG capsule Commonly known as:  PRILOSEC Take 1 capsule (20 mg total) by mouth as needed.   Vitamin D3 2000 units capsule Take by mouth.       Allergies: No Known Allergies  Family History: Family History  Problem Relation Age of Onset  . Hyperlipidemia Mother   . Hypertension Mother   . Hyperlipidemia Father   . Hypertension Father   . Arthritis Maternal Grandmother   .   Cancer Maternal Grandmother     breast  . Arthritis Maternal Grandfather   . Cancer Paternal Uncle     colon  . Prostate cancer Neg Hx   . Kidney cancer Neg Hx   . Bladder Cancer Neg Hx     Social History:  reports that he quit smoking about 28 years ago. He has never used smokeless tobacco. He reports that he drinks alcohol. He reports that he does not use drugs.  ROS: UROLOGY Frequent Urination?: No Hard to postpone urination?: No Burning/pain with urination?: No Get up at night to urinate?: No Leakage of urine?: No Urine stream starts and stops?: No Trouble starting stream?: No Do you have to strain to urinate?: No Blood in urine?: No Urinary tract infection?: No Sexually transmitted disease?: No Injury to kidneys or  bladder?: No Painful intercourse?: No Weak stream?: No Erection problems?: No Penile pain?: No  Gastrointestinal Nausea?: No Vomiting?: No Indigestion/heartburn?: No Diarrhea?: No Constipation?: No  Constitutional Fever: No Night sweats?: No Weight loss?: No Fatigue?: No  Skin Skin rash/lesions?: No Itching?: No  Eyes Blurred vision?: No Double vision?: No  Ears/Nose/Throat Sore throat?: No Sinus problems?: No  Hematologic/Lymphatic Swollen glands?: No Easy bruising?: No  Cardiovascular Leg swelling?: No Chest pain?: No  Respiratory Cough?: No Shortness of breath?: No  Endocrine Excessive thirst?: No  Musculoskeletal Back pain?: No Joint pain?: No  Neurological Headaches?: No Dizziness?: No  Psychologic Depression?: No Anxiety?: No  Physical Exam: BP (!) 163/97 (BP Location: Left Arm, Patient Position: Sitting, Cuff Size: Normal)   Pulse 68   Ht 5' 6" (1.676 m)   Wt 205 lb 8 oz (93.2 kg)   BMI 33.17 kg/m   Constitutional: Well nourished. Alert and oriented, No acute distress. HEENT: [place] AT, moist mucus membranes. Trachea midline, no masses. Cardiovascular: No clubbing, cyanosis, or edema. Respiratory: Normal respiratory effort, no increased work of breathing. GI: Abdomen is soft, non tender, non distended, no abdominal masses. Liver and spleen not palpable.  No hernias appreciated.  Stool sample for occult testing is not indicated.   GU: No CVA tenderness.  No bladder fullness or masses.  Genital and rectal deferred by patient.  Recently examined by PCP.  Skin: No rashes, bruises or suspicious lesions. Lymph: No cervical or inguinal adenopathy. Neurologic: Grossly intact, no focal deficits, moving all 4 extremities. Psychiatric: Normal mood and affect.  Laboratory Data: Lab Results  Component Value Date   WBC 6.4 08/03/2016   HGB 15.5 01/12/2015   HCT 45.1 08/03/2016   MCV 89 08/03/2016   PLT 196 08/03/2016    Lab Results    Component Value Date   CREATININE 0.90 01/26/2017     Lab Results  Component Value Date   HGBA1C 8.1 (H) 08/03/2016    Lab Results  Component Value Date   TSH 2.780 08/03/2016       Component Value Date/Time   CHOL 234 (H) 08/03/2016 0800   HDL 32 (L) 08/03/2016 0800   CHOLHDL 7.3 (H) 08/03/2016 0800   LDLCALC Comment 08/03/2016 0800    Lab Results  Component Value Date   AST 24 08/03/2016   Lab Results  Component Value Date   ALT 35 08/03/2016    Urinalysis 3-10 RBC's.   See EPIC.    Pertinent imaging CLINICAL DATA:  Right lower quadrant pain  EXAM: CT ABDOMEN AND PELVIS WITHOUT AND WITH CONTRAST  TECHNIQUE: Multidetector CT imaging of the abdomen and pelvis was performed following the standard   protocol before and following the bolus administration of intravenous contrast.  CONTRAST:  125mL ISOVUE-300 IOPAMIDOL (ISOVUE-300) INJECTION 61%  COMPARISON:  None.  FINDINGS: Lower chest:  Unremarkable.  Hepatobiliary: No focal abnormality within the liver parenchyma. There is no evidence for gallstones, gallbladder wall thickening, or pericholecystic fluid. No intrahepatic or extrahepatic biliary dilation.  Pancreas: No focal mass lesion. No dilatation of the main duct. No intraparenchymal cyst. No peripancreatic edema.  Spleen: No splenomegaly. No focal mass lesion.  Adrenals/Urinary Tract: No adrenal nodule or mass.  1.5 x 1.2 x 1.6 cm nonobstructing stone is identified in the right renal pelvis. There is some subtle peripelvic edema associated, but no hydronephrosis. A tiny 1-2 mm nonobstructing interpolar right renal stone is associated.  Four stones are seen in the left kidney without hydronephrosis. These range in size from 1-2 mm up to about 6 mm.  No ureteral or bladder stones.  Imaging after IV contrast administration shows no enhancing lesion in either kidney.  Delayed imaging shows no wall thickening or soft tissue  filling defect in either intrarenal collecting system or renal pelvis. Ureters have normal imaging features bilaterally. No focal bladder wall abnormality.  Stomach/Bowel: Stomach is nondistended. No gastric wall thickening. No evidence of outlet obstruction. Duodenum is normally positioned as is the ligament of Treitz. No small bowel wall thickening. No small bowel dilatation. The terminal ileum is normal. The appendix is normal. Diverticular changes are noted in the left colon without evidence of diverticulitis.  Vascular/Lymphatic: There is abdominal aortic atherosclerosis without aneurysm. There is no gastrohepatic or hepatoduodenal ligament lymphadenopathy. No intraperitoneal or retroperitoneal lymphadenopathy.  Reproductive: The prostate gland and seminal vesicles have normal imaging features.  Other: No intraperitoneal free fluid.  Musculoskeletal: Bone windows reveal no worrisome lytic or sclerotic osseous lesions.  IMPRESSION: 1. Bilateral nephrolithiasis including a dominant 1.6 cm nonobstructing stone in the right renal pelvis. There is some mild peripelvic edema associated. No ureteral or bladder stones. 2. Otherwise no acute findings in the abdomen or pelvis. Specifically, the terminal ileum and the appendix are normal. 3.  Abdominal Aortic Atherosclerois (ICD10-170.0)   Electronically Signed   By: Eric  Mansell M.D.   On: 01/26/2017 10:46  Patient will undergo right percutaneous lithotripsy for definitive treatment of a 1.6 cm right renal pelvic stone.  Assessment & Plan:    1. Gross hematuria  - CT Urogram completed with positive findings for 1.6 cm right renal pelvic stone with bilateral nephrolithiasis  2. Right renal pelvic stone  - discussed treatment approaches to renal stones including observation, medical expulsive therapy, ureteroscopic extraction, shockwave lithotripsy and percutaneous nephrolithotomy   - advised against observation and  MET as the stone is very large  - advised against ESWL as the stone's density almost reaches 1500 HU and efficacy would be poor with a high likely hood of Ste[place]rausse  - discussed PCNL vs URS and patient would like to pursue PCNL  - Patient is advised of the risks of PCNL, such as: Bleeding: Blood loss during PCNL is generally minimal, and the risk of blood transfusion ranges from 2-12%. Infection: Bacteria can at times grow within stones and therefore causing urinary tract infection and rarely sepsis during stone surgery, adjacent tissue organ injury: Rarely organ surrounding the kidney such as bowel, colon, blood vessels, spleen, and liver may be injured during surgery requiring emergent open surgery or further surgery. The chest cavity is in close proximity to the upper pole of the kidney and can be axillae entered   when accessing and upper pole kidney stone resulting in a pneumothorax. This may require the small chest tube be placed temporarily to drain air and fluid from around the lung. Permanent damage to the kidney during PCNL resulting in loss of kidneys extremely rare. Damage and perforation to the ureter draining the kidney may result in scarring and obstruction requiring further surgery. Failure to remove the stone: Despite placement of 1 or more tracks into the kidney to remove stones, there is a small chance that PCNL may not be able to successfully remove all the stone as result of either size, number or location of stone within the collecting system. Additional treatment may be required.  - I also explained the risks of general anesthesia, such as: MI, CVA, paralysis, coma and/or death.  - UA  - Urine culture  - BMP  - CBC  - Advised to contact our office or seek treatment in the ED if becomes febrile or pain/ vomiting are difficult control in order to arrange for emergent/urgent intervention  3. Right hydronephrosis/edema  - obtain RUS right renal stone has been eradicated to ensure no  iatrogenic hydronephrosis persists   4. Right flank pain  - likely due to the right renal pelvic stone  5. History of nephrolithiasis  - offered 24 hour metabolic work up once offending stone is addressed     Return for Right PCNL .  These notes generated with voice recognition software. I apologize for typographical errors.  Arrabella Westerman, PA-C  [facility] [address], [address] , [place] [address] [phone]  

## 2017-02-13 ENCOUNTER — Encounter: Payer: Self-pay | Admitting: Urology

## 2017-02-13 ENCOUNTER — Other Ambulatory Visit: Payer: Self-pay

## 2017-02-13 ENCOUNTER — Ambulatory Visit (INDEPENDENT_AMBULATORY_CARE_PROVIDER_SITE_OTHER): Payer: Managed Care, Other (non HMO) | Admitting: Urology

## 2017-02-13 VITALS — BP 163/97 | HR 68 | Ht 66.0 in | Wt 205.5 lb

## 2017-02-13 DIAGNOSIS — R109 Unspecified abdominal pain: Secondary | ICD-10-CM

## 2017-02-13 DIAGNOSIS — R310 Gross hematuria: Secondary | ICD-10-CM | POA: Diagnosis not present

## 2017-02-13 DIAGNOSIS — N200 Calculus of kidney: Secondary | ICD-10-CM

## 2017-02-13 DIAGNOSIS — Z87442 Personal history of urinary calculi: Secondary | ICD-10-CM

## 2017-02-13 DIAGNOSIS — N132 Hydronephrosis with renal and ureteral calculous obstruction: Secondary | ICD-10-CM

## 2017-02-13 LAB — URINALYSIS, COMPLETE
BILIRUBIN UA: NEGATIVE
Ketones, UA: NEGATIVE
LEUKOCYTES UA: NEGATIVE
Nitrite, UA: NEGATIVE
PH UA: 6 (ref 5.0–7.5)
Protein, UA: NEGATIVE
Specific Gravity, UA: 1.02 (ref 1.005–1.030)
UUROB: 0.2 mg/dL (ref 0.2–1.0)

## 2017-02-13 LAB — MICROSCOPIC EXAMINATION: Bacteria, UA: NONE SEEN

## 2017-02-14 ENCOUNTER — Other Ambulatory Visit: Payer: Self-pay

## 2017-02-14 ENCOUNTER — Other Ambulatory Visit: Payer: Self-pay | Admitting: Urology

## 2017-02-14 DIAGNOSIS — N200 Calculus of kidney: Secondary | ICD-10-CM

## 2017-02-14 LAB — CBC WITH DIFFERENTIAL/PLATELET
BASOS ABS: 0 10*3/uL (ref 0.0–0.2)
BASOS: 0 %
EOS (ABSOLUTE): 0.1 10*3/uL (ref 0.0–0.4)
Eos: 2 %
HEMOGLOBIN: 15.2 g/dL (ref 13.0–17.7)
Hematocrit: 46.2 % (ref 37.5–51.0)
IMMATURE GRANS (ABS): 0 10*3/uL (ref 0.0–0.1)
Immature Granulocytes: 0 %
LYMPHS ABS: 2.4 10*3/uL (ref 0.7–3.1)
LYMPHS: 36 %
MCH: 30.5 pg (ref 26.6–33.0)
MCHC: 32.9 g/dL (ref 31.5–35.7)
MCV: 93 fL (ref 79–97)
MONOCYTES: 10 %
Monocytes Absolute: 0.7 10*3/uL (ref 0.1–0.9)
NEUTROS ABS: 3.6 10*3/uL (ref 1.4–7.0)
Neutrophils: 52 %
Platelets: 180 10*3/uL (ref 150–379)
RBC: 4.98 x10E6/uL (ref 4.14–5.80)
RDW: 14.1 % (ref 12.3–15.4)
WBC: 6.8 10*3/uL (ref 3.4–10.8)

## 2017-02-14 LAB — BASIC METABOLIC PANEL
BUN / CREAT RATIO: 13 (ref 9–20)
BUN: 14 mg/dL (ref 6–24)
CALCIUM: 9.4 mg/dL (ref 8.7–10.2)
CHLORIDE: 101 mmol/L (ref 96–106)
CO2: 22 mmol/L (ref 18–29)
Creatinine, Ser: 1.08 mg/dL (ref 0.76–1.27)
GFR, EST AFRICAN AMERICAN: 90 mL/min/{1.73_m2} (ref >59)
GFR, EST NON AFRICAN AMERICAN: 78 mL/min/{1.73_m2} (ref >59)
Glucose: 140 mg/dL — ABNORMAL HIGH (ref 65–99)
POTASSIUM: 4.3 mmol/L (ref 3.5–5.2)
Sodium: 142 mmol/L (ref 134–144)

## 2017-02-14 MED ORDER — HYDROCODONE-ACETAMINOPHEN 5-325 MG PO TABS: 1.0000 | ORAL_TABLET | Freq: Four times a day (QID) | ORAL | 0 refills | Status: DC | PRN

## 2017-02-14 NOTE — Telephone Encounter (Signed)
Patient was notified that surgery has been scheduled for 02-24-17 with Dr. Sherryl BartersBudzyn for a Right PCNL and pre-op phone interview on 02-20-17@1 -5pm. Patient was told per Carollee HerterShannon to stop preventative ASA 81mg  1 week prior to surgery. Patient verbalized understanding and is in agreement with this plan. Patient states he is out of pain medication and needs a refill.

## 2017-02-16 LAB — CULTURE, URINE COMPREHENSIVE

## 2017-02-20 ENCOUNTER — Encounter
Admission: RE | Admit: 2017-02-20 | Discharge: 2017-02-20 | Disposition: A | Discharge status: Home / Self Care | Payer: Managed Care, Other (non HMO) | Source: Ambulatory Visit | Attending: Urology | Admitting: Urology

## 2017-02-20 ENCOUNTER — Telehealth: Payer: Self-pay | Admitting: Radiology

## 2017-02-20 DIAGNOSIS — E119 Type 2 diabetes mellitus without complications: Secondary | ICD-10-CM | POA: Diagnosis not present

## 2017-02-20 DIAGNOSIS — Z87891 Personal history of nicotine dependence: Secondary | ICD-10-CM | POA: Diagnosis not present

## 2017-02-20 DIAGNOSIS — N200 Calculus of kidney: Secondary | ICD-10-CM | POA: Diagnosis not present

## 2017-02-20 DIAGNOSIS — Z8249 Family history of ischemic heart disease and other diseases of the circulatory system: Secondary | ICD-10-CM | POA: Diagnosis not present

## 2017-02-20 DIAGNOSIS — E785 Hyperlipidemia, unspecified: Secondary | ICD-10-CM | POA: Diagnosis not present

## 2017-02-20 DIAGNOSIS — Z7982 Long term (current) use of aspirin: Secondary | ICD-10-CM | POA: Diagnosis not present

## 2017-02-20 DIAGNOSIS — K219 Gastro-esophageal reflux disease without esophagitis: Secondary | ICD-10-CM | POA: Diagnosis not present

## 2017-02-20 DIAGNOSIS — M19042 Primary osteoarthritis, left hand: Secondary | ICD-10-CM | POA: Diagnosis not present

## 2017-02-20 DIAGNOSIS — M19041 Primary osteoarthritis, right hand: Secondary | ICD-10-CM | POA: Diagnosis not present

## 2017-02-20 DIAGNOSIS — I10 Essential (primary) hypertension: Secondary | ICD-10-CM | POA: Diagnosis not present

## 2017-02-20 DIAGNOSIS — Z7984 Long term (current) use of oral hypoglycemic drugs: Secondary | ICD-10-CM | POA: Diagnosis not present

## 2017-02-20 HISTORY — DX: Personal history of urinary calculi: Z87.442

## 2017-02-20 HISTORY — DX: Unspecified osteoarthritis, unspecified site: M19.90

## 2017-02-20 LAB — TYPE AND SCREEN
ABO/RH(D): O POS
Antibody Screen: NEGATIVE

## 2017-02-20 NOTE — Patient Instructions (Signed)
  Your procedure is scheduled on: 02-24-17 (Friday) Report to Same Day Surgery 2nd floor medical mall [facility] Entrance-take elevator on left to 2nd floor.  Check in with surgery information desk.) To find out your arrival time please call [phone] between 1PM - 3PM on 02-23-17 (Thursday)  Remember: Instructions that are not followed completely may result in serious medical risk, up to and including death, or upon the discretion of your surgeon and anesthesiologist your surgery may need to be rescheduled.    _x___ 1. Do not eat food or drink liquids after midnight. No gum chewing or hard candies.     __x__ 2. No Alcohol for 24 hours before or after surgery.   __x__3. No Smoking for 24 prior to surgery.   ____  4. Bring all medications with you on the day of surgery if instructed.    __x__ 5. Notify your doctor if there is any change in your medical condition     (cold, fever, infections).     Do not wear jewelry, make-up, hairpins, clips or nail polish.  Do not wear lotions, powders, or perfumes. You may wear deodorant.  Do not shave 48 hours prior to surgery. Men may shave face and neck.  Do not bring valuables to the hospital.    [facility] is not responsible for any belongings or valuables.               Contacts, dentures or bridgework may not be worn into surgery.  Leave your suitcase in the car. After surgery it may be brought to your room.  For patients admitted to the hospital, discharge time is determined by your                       treatment team.   Patients discharged the day of surgery will not be allowed to drive home.  You will need someone to drive you home and stay with you the night of your procedure.    Please read over the following fact sheets that you were given:     _x___ Take anti-hypertensive (unless it includes a diuretic), cardiac, seizure, asthma,anti-reflux and psychiatric medicines WITH A SMALL SIP OF WATER. These include:  1. PRILOSEC  (OMEPRAZOLE)  2. ALSO TAKE A PRILOSEC ON Thursday NIGHT BEFORE BED  3.  4.  5.  6.  ____Fleets enema or Magnesium Citrate as directed.   _x___ Use CHG Soap or sage wipes as directed on instruction sheet   ____ Use inhalers on the day of surgery and bring to hospital day of surgery  _X___ Stop Metformin and Janumet 2 days prior to surgery-LAST DOSE OF METFORMIN ON Tuesday, April 3RD   ____ Take 1/2 of usual insulin dose the night before surgery and none on the morning surgery.   _x___ Follow recommendations from Cardiologist, Pulmonologist or PCP regarding stopping Aspirin, Coumadin, Plavix ,Eliquis, Effient, or Pradaxa, and Pletal-PT STOPPED ASPIRIN ON LAST THURSDAY  X____Stop Anti-inflammatories such as Advil, Aleve, Ibuprofen, Motrin, Naproxen, MOBIC, Naprosyn, Goodies powders or aspirin products NOW-OK to take Tylenol    ____ Stop supplements until after surgery.    ____ Bring C-Pap to the hospital.

## 2017-02-20 NOTE — Telephone Encounter (Signed)
Notified pt of IR & surgery appts. Advised pt to be npo after 7:00 am on 02/23/17 for nephrostomy tube placement & hold ASA  7 days prior to surgery. Questions were answered & pt voiced understanding.

## 2017-02-22 ENCOUNTER — Other Ambulatory Visit: Payer: Self-pay | Admitting: Radiology

## 2017-02-23 ENCOUNTER — Ambulatory Visit
Admission: RE | Admit: 2017-02-23 | Discharge: 2017-02-23 | Disposition: A | Discharge status: Home / Self Care | Payer: Managed Care, Other (non HMO) | Source: Ambulatory Visit | Attending: Urology | Admitting: Urology

## 2017-02-23 ENCOUNTER — Ambulatory Visit: Payer: Managed Care, Other (non HMO)

## 2017-02-23 DIAGNOSIS — Z87891 Personal history of nicotine dependence: Secondary | ICD-10-CM | POA: Insufficient documentation

## 2017-02-23 DIAGNOSIS — N200 Calculus of kidney: Secondary | ICD-10-CM | POA: Diagnosis not present

## 2017-02-23 DIAGNOSIS — K219 Gastro-esophageal reflux disease without esophagitis: Secondary | ICD-10-CM | POA: Insufficient documentation

## 2017-02-23 DIAGNOSIS — M19042 Primary osteoarthritis, left hand: Secondary | ICD-10-CM | POA: Insufficient documentation

## 2017-02-23 DIAGNOSIS — E785 Hyperlipidemia, unspecified: Secondary | ICD-10-CM | POA: Insufficient documentation

## 2017-02-23 DIAGNOSIS — E119 Type 2 diabetes mellitus without complications: Secondary | ICD-10-CM | POA: Insufficient documentation

## 2017-02-23 DIAGNOSIS — Z7982 Long term (current) use of aspirin: Secondary | ICD-10-CM | POA: Insufficient documentation

## 2017-02-23 DIAGNOSIS — I10 Essential (primary) hypertension: Secondary | ICD-10-CM | POA: Insufficient documentation

## 2017-02-23 DIAGNOSIS — Z8249 Family history of ischemic heart disease and other diseases of the circulatory system: Secondary | ICD-10-CM | POA: Insufficient documentation

## 2017-02-23 DIAGNOSIS — Z7984 Long term (current) use of oral hypoglycemic drugs: Secondary | ICD-10-CM | POA: Insufficient documentation

## 2017-02-23 DIAGNOSIS — M19041 Primary osteoarthritis, right hand: Secondary | ICD-10-CM | POA: Insufficient documentation

## 2017-02-23 HISTORY — PX: IR NEPHROSTOMY PLACEMENT RIGHT: IMG6064

## 2017-02-23 LAB — PROTIME-INR
INR: 0.94
PROTHROMBIN TIME: 12.6 s (ref 11.4–15.2)

## 2017-02-23 LAB — BASIC METABOLIC PANEL
ANION GAP: 9 (ref 5–15)
BUN: 13 mg/dL (ref 6–20)
CHLORIDE: 105 mmol/L (ref 101–111)
CO2: 27 mmol/L (ref 22–32)
Calcium: 9.8 mg/dL (ref 8.9–10.3)
Creatinine, Ser: 0.9 mg/dL (ref 0.61–1.24)
GFR calc non Af Amer: >60 mL/min (ref >60)
Glucose, Bld: 103 mg/dL — ABNORMAL HIGH (ref 65–99)
Potassium: 4.3 mmol/L (ref 3.5–5.1)
SODIUM: 141 mmol/L (ref 135–145)

## 2017-02-23 LAB — CBC
HCT: 48.2 % (ref 40.0–52.0)
HEMOGLOBIN: 16.6 g/dL (ref 13.0–18.0)
MCH: 30.9 pg (ref 26.0–34.0)
MCHC: 34.5 g/dL (ref 32.0–36.0)
MCV: 89.5 fL (ref 80.0–100.0)
Platelets: 229 10*3/uL (ref 150–440)
RBC: 5.39 MIL/uL (ref 4.40–5.90)
RDW: 13.5 % (ref 11.5–14.5)
WBC: 8.9 10*3/uL (ref 3.8–10.6)

## 2017-02-23 LAB — APTT: aPTT: 34 seconds (ref 24–36)

## 2017-02-23 MED ORDER — HYDROCODONE-ACETAMINOPHEN 5-325 MG PO TABS
1.0000 | ORAL_TABLET | ORAL | Status: DC | PRN
  Administered 2017-02-23: 2 via ORAL

## 2017-02-23 MED ORDER — CEFAZOLIN SODIUM-DEXTROSE 2-4 GM/100ML-% IV SOLN
2.0000 g | Freq: Once | INTRAVENOUS | Status: AC
  Administered 2017-02-24: 2 g via INTRAVENOUS

## 2017-02-23 MED ORDER — SODIUM CHLORIDE 0.9 % IV SOLN
INTRAVENOUS | Status: DC
  Administered 2017-02-23: 14:00:00 via INTRAVENOUS

## 2017-02-23 MED ORDER — LIDOCAINE HCL (PF) 1 % IJ SOLN
INTRAMUSCULAR | Status: AC | PRN
  Administered 2017-02-23: 10 mL

## 2017-02-23 MED ORDER — CIPROFLOXACIN IN D5W 400 MG/200ML IV SOLN
400.0000 mg | INTRAVENOUS | Status: AC
  Administered 2017-02-23: 400 mg via INTRAVENOUS

## 2017-02-23 MED ORDER — IOPAMIDOL (ISOVUE-300) INJECTION 61%
30.0000 mL | Freq: Once | INTRAVENOUS | Status: AC | PRN
  Administered 2017-02-23: 30 mL

## 2017-02-23 MED ORDER — FENTANYL CITRATE (PF) 100 MCG/2ML IJ SOLN
INTRAMUSCULAR | Status: AC | PRN
  Administered 2017-02-23: 25 ug via INTRAVENOUS
  Administered 2017-02-23: 50 ug via INTRAVENOUS
  Administered 2017-02-23: 25 ug via INTRAVENOUS

## 2017-02-23 MED ORDER — MIDAZOLAM HCL 5 MG/5ML IJ SOLN
INTRAMUSCULAR | Status: AC | PRN
  Administered 2017-02-23: 0.5 mg via INTRAVENOUS
  Administered 2017-02-23: 1 mg via INTRAVENOUS
  Administered 2017-02-23 (×2): 0.5 mg via INTRAVENOUS
  Administered 2017-02-23: 1 mg via INTRAVENOUS

## 2017-02-23 MED ORDER — FENTANYL CITRATE (PF) 100 MCG/2ML IJ SOLN
INTRAMUSCULAR | Status: AC
  Filled 2017-02-23: qty 2

## 2017-02-23 NOTE — H&P (Signed)
Chief Complaint: Patient was seen in consultation today for right nephrostomy/ureteral access at the request of Hildred Laser  Referring Physician(s): Hildred Laser  Patient Status: [facility] - Out-pt  History of Present Illness: Caleb Warren is a 56 y.o. male with history of a 1.7 cm right renal pelvic calculus presenting for percutaneous access prior to planned PCNL surgery tomorrow.  Some right flank pain and hematuria recently.  No pain currently.  Past Medical History:  Diagnosis Date  . Arthritis    HANDS  . Chicken pox   . Diabetes (HCC)   . Diverticulitis   . GERD (gastroesophageal reflux disease)   . History of kidney stones   . Hyperlipidemia     Past Surgical History:  Procedure Laterality Date  . COLONOSCOPY  2016  . 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 Authorizing Provider  ibuprofen (ADVIL,MOTRIN) 200 MG tablet Take 400 mg by mouth every 8 (eight) hours as needed (for pain/headache.).   Yes Historical Provider, MD  omeprazole (PRILOSEC OTC) 20 MG tablet Take 20 mg by mouth every 3 (three) days.   Yes Historical Provider, MD  acetaminophen (TYLENOL) 325 MG tablet Take 650 mg by mouth every 6 (six) hours as needed (for pain/headache.).    Historical Provider, MD  aspirin EC 81 MG tablet Take 81 mg by mouth daily.    Historical Provider, MD  atorvastatin (LIPITOR) 40 MG tablet Take 1 tablet (40 mg total) by mouth daily. Patient taking differently: Take 40 mg by mouth every evening.  08/10/16   Glori Luis, MD  HYDROcodone-acetaminophen (NORCO/VICODIN) 5-325 MG tablet Take 1 tablet by mouth every 6 (six) hours as needed for moderate pain. 02/14/17   Harle Battiest, PA-C  meloxicam (MOBIC) 15 MG tablet Take 1 tablet (15 mg total) by mouth daily. 01/19/17   Delano Metz, MD  metFORMIN (GLUCOPHAGE) 500 MG tablet Take 1  tablet (500 mg total) by mouth 2 (two) times daily with a meal. 08/08/16   Glori Luis, MD     Family History  Problem Relation Age of Onset  . Hyperlipidemia Mother   . Hypertension Mother   . Hyperlipidemia Father   . Hypertension Father   . Arthritis Maternal Grandmother   . Cancer Maternal Grandmother     breast  . Arthritis Maternal Grandfather   . Cancer Paternal Uncle     colon  . Prostate cancer Neg Hx   . Kidney cancer Neg Hx   . Bladder Cancer Neg Hx     Social History   Social History  . Marital status: Married    Spouse name: N/A  . Number of children: N/A  . Years of education: N/A   Social History Main Topics  . Smoking status: Former Smoker    Packs/day: 0.50    Years: 5.00    Types: Cigarettes    Quit date: 11/21/1988  . Smokeless tobacco: Never Used  . Alcohol use Yes     Comment: occ  . Drug use: No  . Sexual activity: Not Asked   Other Topics Concern  . None   Social History Narrative   Lives in [place]. No pets. Works for [employer].      Diet - regular diet, limited red meat   Exercise - walks    Review of Systems: A 12 point ROS discussed and  pertinent positives are indicated in the HPI above.  All other systems are negative.  Review of Systems  Constitutional: Negative.   Respiratory: Negative.   Cardiovascular: Negative.   Gastrointestinal: Negative.   Genitourinary: Negative.   Musculoskeletal: Negative.   Neurological: Negative.   All other systems reviewed and are negative.   Vital Signs: BP (!) 158/107   Pulse 78   Temp 98.3 F (36.8 C) (Oral)   Resp 20   SpO2 99%   Physical Exam  Constitutional: He is oriented to person, place, and time. He appears well-developed and well-nourished. No distress.  Cardiovascular: Normal rate, regular rhythm and normal heart sounds.   Pulmonary/Chest: Effort normal and breath sounds normal. No respiratory distress. He has no wheezes. He has no rales.  Abdominal: Soft.  Bowel sounds are normal. He exhibits no distension and no mass. There is no tenderness. There is no guarding.  Neurological: He is alert and oriented to person, place, and time.  Skin: He is not diaphoretic.    Mallampati Score:  MD Evaluation Airway: WNL Heart: WNL Abdomen: WNL Chest/ Lungs: WNL ASA  Classification: 2 Mallampati/Airway Score: One  Imaging: Ct Hematuria Workup  Result Date: 01/26/2017 CLINICAL DATA:  Right lower quadrant pain EXAM: CT ABDOMEN AND PELVIS WITHOUT AND WITH CONTRAST TECHNIQUE: Multidetector CT imaging of the abdomen and pelvis was performed following the standard protocol before and following the bolus administration of intravenous contrast. CONTRAST:  ISOVUE-300 IOPAMIDOL (ISOVUE-300) INJECTION 61% COMPARISON:  None. FINDINGS: Lower chest:  Unremarkable. Hepatobiliary: No focal abnormality within the liver parenchyma. There is no evidence for gallstones, gallbladder wall thickening, or pericholecystic fluid. No intrahepatic or extrahepatic biliary dilation. Pancreas: No focal mass lesion. No dilatation of the main duct. No intraparenchymal cyst. No peripancreatic edema. Spleen: No splenomegaly. No focal mass lesion. Adrenals/Urinary Tract: No adrenal nodule or mass. 1.5 x 1.2 x 1.6 cm nonobstructing stone is identified in the right renal pelvis. There is some subtle peripelvic edema associated, but no hydronephrosis. A tiny 1-2 mm nonobstructing interpolar right renal stone is associated. Four stones are seen in the left kidney without hydronephrosis. These range in size from 1-2 mm up to about 6 mm. No ureteral or bladder stones. Imaging after IV contrast administration shows no enhancing lesion in either kidney. Delayed imaging shows no wall thickening or soft tissue filling defect in either intrarenal collecting system or renal pelvis. Ureters have normal imaging features bilaterally. No focal bladder wall abnormality. Stomach/Bowel: Stomach is nondistended.  No gastric wall thickening. No evidence of outlet obstruction. Duodenum is normally positioned as is the ligament of Treitz. No small bowel wall thickening. No small bowel dilatation. The terminal ileum is normal. The appendix is normal. Diverticular changes are noted in the left colon without evidence of diverticulitis. Vascular/Lymphatic: There is abdominal aortic atherosclerosis without aneurysm. There is no gastrohepatic or hepatoduodenal ligament lymphadenopathy. No intraperitoneal or retroperitoneal lymphadenopathy. Reproductive: The prostate gland and seminal vesicles have normal imaging features. Other: No intraperitoneal free fluid. Musculoskeletal: Bone windows reveal no worrisome lytic or sclerotic osseous lesions. IMPRESSION: 1. Bilateral nephrolithiasis including a dominant 1.6 cm nonobstructing stone in the right renal pelvis. There is some mild peripelvic edema associated. No ureteral or bladder stones. 2. Otherwise no acute findings in the abdomen or pelvis. Specifically, the terminal ileum and the appendix are normal. 3.  Abdominal Aortic Atherosclerois (ICD10-170.0) Electronically Signed   By: Kennith Center M.D.   On: 01/26/2017 10:46    Labs:  CBC:  Recent Labs  08/03/16 0800 02/13/17 0921 02/23/17 1333  WBC 6.4 6.8 8.9  HGB  --   --  16.6  HCT 45.1 46.2 48.2  PLT 196 180 229    COAGS:  Recent Labs  02/23/17 1333  INR 0.94  APTT 34    BMP:  Recent Labs  08/03/16 0800 01/26/17 0920 02/13/17 0921 02/23/17 1333  NA 144  --  142 141  K 4.4  --  4.3 4.3  CL 102  --  101 105  CO2  --   --  22 27  GLUCOSE 179*  --  140* 103*  BUN 11  --  14 13  CALCIUM 8.7  --  9.4 9.8  CREATININE 0.95 0.90 1.08 0.90  GFRNONAA 92  --  78 >60  GFRAA 106  --  90 >60    LIVER FUNCTION TESTS:  Recent Labs  08/03/16 0800  BILITOT 0.2  AST 24  ALT 35  ALKPHOS 63  PROT 6.8  ALBUMIN 4.4    Assessment and Plan:  For right percutaneous ureteral catheter placement prior  to PCNL tomorrow.  Consent obtained.  Risks and Benefits discussed with the patient including, but not limited to infection, bleeding, significant bleeding causing loss or decrease in renal function or damage to adjacent structures.  All of the patient's questions were answered, patient is agreeable to proceed. Consent signed and in chart. Hypertensive with significant diastolic HTN currently.  Will monitor during case and consider additional meds after sedation during procedure.   Thank you for this interesting consult.  I greatly enjoyed meeting Caleb Warren and look forward to participating in their care.  A copy of this report was sent to the requesting provider on this date.  Electronically SignedIrish Lack T 02/23/2017, 2:20 PM   I spent a total of 30 Minutes in face to face in clinical consultation, greater than 50% of which was counseling/coordinating care for right percutaneous ureteral catheter placement.

## 2017-02-23 NOTE — Procedures (Signed)
Interventional Radiology Procedure Note  Procedure: Right ureteral catheter placement  Complications: None  Estimated Blood Loss: < 10 mL  Right upper pole renal access with 21 G needle for contrast opacification of collecting system. Lower pole access performed with eventual advancement of 5 Fr catheter into distal ureter, and around renal pelvic calculus. Catheter capped and secured with retention suture and overlying dressing.  Jodi Marble. Fredia Sorrow, M.D Pager:  [phone]

## 2017-02-24 ENCOUNTER — Ambulatory Visit: Payer: Managed Care, Other (non HMO) | Admitting: Anesthesiology

## 2017-02-24 ENCOUNTER — Encounter
Admission: RE | Disposition: A | Discharge status: Home / Self Care | Payer: Self-pay | Source: Ambulatory Visit | Attending: Urology

## 2017-02-24 ENCOUNTER — Ambulatory Visit: Payer: Managed Care, Other (non HMO)

## 2017-02-24 ENCOUNTER — Encounter: Payer: Self-pay | Admitting: *Deleted

## 2017-02-24 ENCOUNTER — Observation Stay
Admission: RE | Admit: 2017-02-24 | Discharge: 2017-02-26 | Disposition: A | Discharge status: Home / Self Care | Payer: Managed Care, Other (non HMO) | Source: Ambulatory Visit | Attending: Urology | Admitting: Urology

## 2017-02-24 ENCOUNTER — Telehealth: Payer: Self-pay | Admitting: Urology

## 2017-02-24 DIAGNOSIS — R109 Unspecified abdominal pain: Secondary | ICD-10-CM | POA: Diagnosis not present

## 2017-02-24 DIAGNOSIS — I10 Essential (primary) hypertension: Secondary | ICD-10-CM | POA: Insufficient documentation

## 2017-02-24 DIAGNOSIS — Z87442 Personal history of urinary calculi: Secondary | ICD-10-CM | POA: Diagnosis not present

## 2017-02-24 DIAGNOSIS — N200 Calculus of kidney: Secondary | ICD-10-CM

## 2017-02-24 DIAGNOSIS — N132 Hydronephrosis with renal and ureteral calculous obstruction: Principal | ICD-10-CM | POA: Insufficient documentation

## 2017-02-24 DIAGNOSIS — Z7982 Long term (current) use of aspirin: Secondary | ICD-10-CM | POA: Diagnosis not present

## 2017-02-24 DIAGNOSIS — R509 Fever, unspecified: Secondary | ICD-10-CM | POA: Insufficient documentation

## 2017-02-24 DIAGNOSIS — Z87891 Personal history of nicotine dependence: Secondary | ICD-10-CM | POA: Diagnosis not present

## 2017-02-24 DIAGNOSIS — E119 Type 2 diabetes mellitus without complications: Secondary | ICD-10-CM | POA: Insufficient documentation

## 2017-02-24 DIAGNOSIS — Z79899 Other long term (current) drug therapy: Secondary | ICD-10-CM | POA: Diagnosis not present

## 2017-02-24 DIAGNOSIS — Z7984 Long term (current) use of oral hypoglycemic drugs: Secondary | ICD-10-CM | POA: Insufficient documentation

## 2017-02-24 DIAGNOSIS — K219 Gastro-esophageal reflux disease without esophagitis: Secondary | ICD-10-CM | POA: Diagnosis not present

## 2017-02-24 DIAGNOSIS — E785 Hyperlipidemia, unspecified: Secondary | ICD-10-CM | POA: Diagnosis not present

## 2017-02-24 DIAGNOSIS — Z419 Encounter for procedure for purposes other than remedying health state, unspecified: Secondary | ICD-10-CM

## 2017-02-24 HISTORY — PX: NEPHROLITHOTOMY: SHX5134

## 2017-02-24 HISTORY — DX: Calculus of kidney: N20.0

## 2017-02-24 HISTORY — PX: CYSTOSCOPY WITH STENT PLACEMENT: SHX5790

## 2017-02-24 HISTORY — DX: Failed or difficult intubation, initial encounter: T88.4XXA

## 2017-02-24 LAB — GLUCOSE, CAPILLARY
GLUCOSE-CAPILLARY: 165 mg/dL — AB (ref 65–99)
GLUCOSE-CAPILLARY: 127 mg/dL — AB (ref 65–99)
GLUCOSE-CAPILLARY: 190 mg/dL — AB (ref 65–99)
Glucose-Capillary: 124 mg/dL — ABNORMAL HIGH (ref 65–99)
Glucose-Capillary: 137 mg/dL — ABNORMAL HIGH (ref 65–99)

## 2017-02-24 LAB — ABO/RH: ABO/RH(D): O POS

## 2017-02-24 SURGERY — NEPHROLITHOTOMY PERCUTANEOUS
Anesthesia: General | Site: Back | Laterality: Right | Wound class: Clean

## 2017-02-24 MED ORDER — INSULIN ASPART 100 UNIT/ML ~~LOC~~ SOLN
0.0000 [IU] | SUBCUTANEOUS | Status: DC
  Administered 2017-02-24: 2 [IU] via SUBCUTANEOUS
  Administered 2017-02-24: 4 [IU] via SUBCUTANEOUS
  Administered 2017-02-24: 2 [IU] via SUBCUTANEOUS
  Administered 2017-02-25: 4 [IU] via SUBCUTANEOUS
  Administered 2017-02-25 – 2017-02-26 (×4): 2 [IU] via SUBCUTANEOUS
  Filled 2017-02-24: qty 4
  Filled 2017-02-24 (×5): qty 2
  Filled 2017-02-24: qty 4
  Filled 2017-02-24: qty 2

## 2017-02-24 MED ORDER — OMEPRAZOLE MAGNESIUM 20 MG PO TBEC: 20.0000 mg | DELAYED_RELEASE_TABLET | ORAL | Status: DC

## 2017-02-24 MED ORDER — MORPHINE SULFATE (PF) 2 MG/ML IV SOLN: 2.0000 mg | INTRAVENOUS | Status: DC | PRN

## 2017-02-24 MED ORDER — PHENYLEPHRINE HCL 10 MG/ML IJ SOLN
INTRAMUSCULAR | Status: DC | PRN
  Administered 2017-02-24: 200 ug via INTRAVENOUS
  Administered 2017-02-24 (×2): 100 ug via INTRAVENOUS

## 2017-02-24 MED ORDER — FENTANYL CITRATE (PF) 100 MCG/2ML IJ SOLN
INTRAMUSCULAR | Status: AC
  Filled 2017-02-24: qty 2

## 2017-02-24 MED ORDER — HYDROCODONE-ACETAMINOPHEN 5-325 MG PO TABS
1.0000 | ORAL_TABLET | ORAL | Status: DC | PRN
Start: 2017-02-24 — End: 2017-02-26
  Administered 2017-02-24 – 2017-02-25 (×6): 2 via ORAL
  Administered 2017-02-26 (×2): 1 via ORAL
  Filled 2017-02-24: qty 1
  Filled 2017-02-24 (×8): qty 2

## 2017-02-24 MED ORDER — SUCCINYLCHOLINE CHLORIDE 20 MG/ML IJ SOLN
INTRAMUSCULAR | Status: DC | PRN
  Administered 2017-02-24: 100 mg via INTRAVENOUS

## 2017-02-24 MED ORDER — HEPARIN SODIUM (PORCINE) 5000 UNIT/ML IJ SOLN
5000.0000 [IU] | Freq: Three times a day (TID) | INTRAMUSCULAR | Status: DC
  Administered 2017-02-24 – 2017-02-26 (×6): 5000 [IU] via SUBCUTANEOUS
  Filled 2017-02-24 (×6): qty 1

## 2017-02-24 MED ORDER — SODIUM CHLORIDE 0.9 % IV SOLN
INTRAVENOUS | Status: DC
  Administered 2017-02-24 (×2): via INTRAVENOUS

## 2017-02-24 MED ORDER — ONDANSETRON HCL 4 MG/2ML IJ SOLN: 4.0000 mg | INTRAMUSCULAR | Status: DC | PRN

## 2017-02-24 MED ORDER — CEPHALEXIN 500 MG PO CAPS: 500.0000 mg | ORAL_CAPSULE | Freq: Three times a day (TID) | ORAL | 0 refills | Status: DC

## 2017-02-24 MED ORDER — SUGAMMADEX SODIUM 200 MG/2ML IV SOLN
INTRAVENOUS | Status: DC | PRN
  Administered 2017-02-24: 200 mg via INTRAVENOUS

## 2017-02-24 MED ORDER — MIDAZOLAM HCL 2 MG/2ML IJ SOLN
INTRAMUSCULAR | Status: AC
  Filled 2017-02-24: qty 2

## 2017-02-24 MED ORDER — MIDAZOLAM HCL 2 MG/2ML IJ SOLN
INTRAMUSCULAR | Status: DC | PRN
Start: 2017-02-24 — End: 2017-02-24
  Administered 2017-02-24: 2 mg via INTRAVENOUS

## 2017-02-24 MED ORDER — PROPOFOL 10 MG/ML IV BOLUS
INTRAVENOUS | Status: AC
  Filled 2017-02-24: qty 20

## 2017-02-24 MED ORDER — FENTANYL CITRATE (PF) 100 MCG/2ML IJ SOLN
INTRAMUSCULAR | Status: AC
  Filled 2017-02-24: qty 4

## 2017-02-24 MED ORDER — FENTANYL CITRATE (PF) 100 MCG/2ML IJ SOLN
INTRAMUSCULAR | Status: AC
  Administered 2017-02-24: 25 ug via INTRAVENOUS
  Filled 2017-02-24: qty 2

## 2017-02-24 MED ORDER — SODIUM CHLORIDE 0.9 % IV SOLN
INTRAVENOUS | Status: DC
  Administered 2017-02-24 – 2017-02-25 (×2): via INTRAVENOUS

## 2017-02-24 MED ORDER — CEFAZOLIN SODIUM-DEXTROSE 2-4 GM/100ML-% IV SOLN
INTRAVENOUS | Status: AC
  Administered 2017-02-24: 2 g via INTRAVENOUS
  Filled 2017-02-24: qty 100

## 2017-02-24 MED ORDER — DOCUSATE SODIUM 100 MG PO CAPS
100.0000 mg | ORAL_CAPSULE | Freq: Two times a day (BID) | ORAL | Status: DC
  Administered 2017-02-24 – 2017-02-26 (×5): 100 mg via ORAL
  Filled 2017-02-24 (×5): qty 1

## 2017-02-24 MED ORDER — PROPOFOL 10 MG/ML IV BOLUS
INTRAVENOUS | Status: DC | PRN
  Administered 2017-02-24: 200 mg via INTRAVENOUS

## 2017-02-24 MED ORDER — CEFAZOLIN SODIUM-DEXTROSE 2-4 GM/100ML-% IV SOLN
2.0000 g | Freq: Three times a day (TID) | INTRAVENOUS | Status: DC
  Filled 2017-02-24 (×2): qty 100

## 2017-02-24 MED ORDER — OXYCODONE HCL 5 MG PO TABS: 5.0000 mg | ORAL_TABLET | Freq: Once | ORAL | Status: DC | PRN

## 2017-02-24 MED ORDER — DEXTROSE 5 % IV SOLN
2.0000 g | Freq: Three times a day (TID) | INTRAVENOUS | Status: AC
  Administered 2017-02-24 – 2017-02-25 (×3): 2 g via INTRAVENOUS
  Filled 2017-02-24 (×3): qty 2000

## 2017-02-24 MED ORDER — LIDOCAINE HCL (PF) 2 % IJ SOLN
INTRAMUSCULAR | Status: AC
  Filled 2017-02-24: qty 2

## 2017-02-24 MED ORDER — ONDANSETRON HCL 4 MG/2ML IJ SOLN
INTRAMUSCULAR | Status: AC
  Filled 2017-02-24: qty 2

## 2017-02-24 MED ORDER — FENTANYL CITRATE (PF) 100 MCG/2ML IJ SOLN
25.0000 ug | INTRAMUSCULAR | Status: DC | PRN
  Administered 2017-02-24 (×4): 25 ug via INTRAVENOUS

## 2017-02-24 MED ORDER — ONDANSETRON HCL 4 MG/2ML IJ SOLN
INTRAMUSCULAR | Status: DC | PRN
  Administered 2017-02-24: 4 mg via INTRAVENOUS

## 2017-02-24 MED ORDER — HYDROCODONE-ACETAMINOPHEN 5-325 MG PO TABS: 1.0000 | ORAL_TABLET | ORAL | 0 refills | Status: DC | PRN

## 2017-02-24 MED ORDER — ATORVASTATIN CALCIUM 20 MG PO TABS
40.0000 mg | ORAL_TABLET | Freq: Every evening | ORAL | Status: DC
  Administered 2017-02-24 – 2017-02-25 (×2): 40 mg via ORAL
  Filled 2017-02-24 (×2): qty 2

## 2017-02-24 MED ORDER — SEVOFLURANE IN SOLN
RESPIRATORY_TRACT | Status: AC
  Filled 2017-02-24: qty 250

## 2017-02-24 MED ORDER — ROCURONIUM BROMIDE 100 MG/10ML IV SOLN
INTRAVENOUS | Status: DC | PRN
  Administered 2017-02-24: 35 mg via INTRAVENOUS
  Administered 2017-02-24 (×2): 10 mg via INTRAVENOUS
  Administered 2017-02-24: 5 mg via INTRAVENOUS

## 2017-02-24 MED ORDER — OXYCODONE HCL 5 MG/5ML PO SOLN: 5.0000 mg | Freq: Once | ORAL | Status: DC | PRN

## 2017-02-24 MED ORDER — FENTANYL CITRATE (PF) 100 MCG/2ML IJ SOLN
INTRAMUSCULAR | Status: DC | PRN
  Administered 2017-02-24: 50 ug via INTRAVENOUS
  Administered 2017-02-24: 100 ug via INTRAVENOUS
  Administered 2017-02-24: 50 ug via INTRAVENOUS

## 2017-02-24 SURGICAL SUPPLY — 53 items
ADAPTER IRRIG TUBE 2 SPIKE SOL (ADAPTER) ×10 IMPLANT
BAG URO DRAIN 2000ML W/SPOUT (MISCELLANEOUS) ×5 IMPLANT
BALLN NEPHROMAX 30X10X12 (MISCELLANEOUS) ×5
BALLOON NEPHROMAX 30X10X12 (MISCELLANEOUS) ×3 IMPLANT
BLADE SURG 15 STRL LF DISP TIS (BLADE) ×3 IMPLANT
BLADE SURG 15 STRL SS (BLADE) ×2
CATH COUNCIL 22FR (CATHETERS) IMPLANT
CATH FOL LEG HOLDER (MISCELLANEOUS) ×5 IMPLANT
CATH FOLEY 2W COUNCIL 20FR 5CC (CATHETERS) ×5 IMPLANT
CATH FOLEY 2W COUNCIL 5CC 18FR (CATHETERS) IMPLANT
CATH TRAY 16F METER LATEX (MISCELLANEOUS) ×5 IMPLANT
CATH URETL 5X70 OPEN END (CATHETERS) ×5 IMPLANT
CHLORAPREP W/TINT 26ML (MISCELLANEOUS) ×5 IMPLANT
CNTNR SPEC 2.5X3XGRAD LEK (MISCELLANEOUS) ×3
CONRAY 43 FOR UROLOGY 50M (MISCELLANEOUS) ×10 IMPLANT
CONT SPEC 4OZ STER OR WHT (MISCELLANEOUS) ×2
CONTAINER SPEC 2.5X3XGRAD LEK (MISCELLANEOUS) ×3 IMPLANT
DRAPE C-ARM XRAY 36X54 (DRAPES) ×5 IMPLANT
DRAPE SHEET LG 3/4 BI-LAMINATE (DRAPES) ×5 IMPLANT
GAUZE SPONGE 4X4 12PLY STRL (GAUZE/BANDAGES/DRESSINGS) ×5 IMPLANT
GLOVE BIO SURGEON STRL SZ7 (GLOVE) ×10 IMPLANT
GOWN STRL REUS W/ TWL LRG LVL3 (GOWN DISPOSABLE) ×6 IMPLANT
GOWN STRL REUS W/TWL LRG LVL3 (GOWN DISPOSABLE) ×4
GUIDEWIRE GREEN .038 145CM (MISCELLANEOUS) ×5 IMPLANT
GUIDEWIRE INTRO SET STRAIGHT (WIRE) IMPLANT
GUIDEWIRE STR ZIPWIRE 035X150 (MISCELLANEOUS) IMPLANT
GUIDEWIRE SUPER STIFF .035X180 (WIRE) ×10 IMPLANT
INTRODUCER DILATOR DOUBLE (INTRODUCER) ×5 IMPLANT
MANIFOLD NEPTUNE II (INSTRUMENTS) ×5 IMPLANT
NDL FASCIA INCISION 18GA (NEEDLE) ×5 IMPLANT
PACK BASIN MINOR ARMC (MISCELLANEOUS) ×5 IMPLANT
PROBE CYBERWAND SET (MISCELLANEOUS) ×5 IMPLANT
SENSORWIRE 0.038 NOT ANGLED (WIRE) ×5
SET IRRIG Y TYPE TUR BLADDER L (SET/KITS/TRAYS/PACK) IMPLANT
SET IRRIGATING DISP (SET/KITS/TRAYS/PACK) ×5 IMPLANT
SHEET NEURO XL SOL CTL (MISCELLANEOUS) ×5 IMPLANT
SOL .9 NS 3000ML IRR  AL (IV SOLUTION) ×8
SOL .9 NS 3000ML IRR UROMATIC (IV SOLUTION) ×12 IMPLANT
SPONGE DRAIN TRACH 4X4 STRL 2S (GAUZE/BANDAGES/DRESSINGS) ×5 IMPLANT
STENT URET 6FRX24 CONTOUR (STENTS) IMPLANT
STENT URET 6FRX26 CONTOUR (STENTS) ×5 IMPLANT
SUT SILK 0 SH 30 (SUTURE) ×5 IMPLANT
SYR 20CC LL (SYRINGE) ×5 IMPLANT
SYR 30ML LL (SYRINGE) ×5 IMPLANT
SYRINGE 10CC LL (SYRINGE) ×5 IMPLANT
SYRINGE IRR TOOMEY STRL 70CC (SYRINGE) ×5 IMPLANT
TAPE MICROFOAM 4IN (TAPE) ×5 IMPLANT
TRAP SPECIMEN MUCOUS 40CC (MISCELLANEOUS) IMPLANT
TUBING CONNECTING 10 (TUBING) ×4 IMPLANT
TUBING CONNECTING 10' (TUBING) ×1
URETL UROMAX ULTRA 21F 7X4 (MISCELLANEOUS) IMPLANT
WATER STERILE IRR 1000ML POUR (IV SOLUTION) IMPLANT
WIRE SENSOR 0.038 NOT ANGLED (WIRE) ×3 IMPLANT

## 2017-02-24 NOTE — Anesthesia Preprocedure Evaluation (Signed)
Anesthesia Evaluation  Patient identified by MRN, date of birth, ID band Patient awake    Reviewed: Allergy & Precautions, H&P , NPO status , Patient's Chart, lab work & pertinent test results  Airway Mallampati: III  TM Distance: <3 FB Neck ROM: full    Dental  (+) Poor Dentition, Chipped   Pulmonary neg shortness of breath, former smoker,    Pulmonary exam normal breath sounds clear to auscultation       Cardiovascular Exercise Tolerance: Good hypertension, (-) angina(-) Past MI and (-) DOE Normal cardiovascular exam Rhythm:regular Rate:Normal     Neuro/Psych negative neurological ROS  negative psych ROS   GI/Hepatic Neg liver ROS, GERD  Medicated and Controlled,  Endo/Other  diabetes, Type 2, Oral Hypoglycemic Agents  Renal/GU      Musculoskeletal   Abdominal   Peds  Hematology negative hematology ROS (+)   Anesthesia Other Findings Signs and symptoms suggestive of sleep apnea   Past Medical History: No date: Arthritis     Comment: HANDS No date: Chicken pox No date: Diabetes (HCC) No date: Diverticulitis No date: GERD (gastroesophageal reflux disease) No date: History of kidney stones No date: Hyperlipidemia  Past Surgical History: 2016: COLONOSCOPY 02/23/2017: IR NEPHROSTOMY PLACEMENT RIGHT 1994: KIDNEY STONE SURGERY  BMI    Body Mass Index:  33.09 kg/m      Reproductive/Obstetrics negative OB ROS                             Anesthesia Physical Anesthesia Plan  ASA: III  Anesthesia Plan: General ETT   Post-op Pain Management:    Induction:   Airway Management Planned:   Additional Equipment:   Intra-op Plan:   Post-operative Plan:   Informed Consent: I have reviewed the patients History and Physical, chart, labs and discussed the procedure including the risks, benefits and alternatives for the proposed anesthesia with the patient or authorized  representative who has indicated his/her understanding and acceptance.   Dental Advisory Given  Plan Discussed with: Anesthesiologist, CRNA and Surgeon  Anesthesia Plan Comments:         Anesthesia Quick Evaluation

## 2017-02-24 NOTE — Interval H&P Note (Signed)
History and Physical Interval Note:  02/24/2017 9:41 AM  Caleb Warren  has presented today for surgery, with the diagnosis of RIGHT RENAL PELVIC STONE   The various methods of treatment have been discussed with the patient and family. After consideration of risks, benefits and other options for treatment, the patient has consented to  Procedure(s): NEPHROLITHOTOMY PERCUTANEOUS (N/A) HOLMIUM LASER APPLICATION (N/A) as a surgical intervention .  The patient's history has been reviewed, patient examined, no change in status, stable for surgery.  I have reviewed the patient's chart and labs.  Questions were answered to the patient's satisfaction.    RRR Lungs clear   Hildred Laser

## 2017-02-24 NOTE — Telephone Encounter (Signed)
paient appt made and Jola Babinski on 2-C was notified about appt, asked Chelsea to put the order for the KUB in per dr. Derrill Center

## 2017-02-24 NOTE — Anesthesia Post-op Follow-up Note (Cosign Needed)
Anesthesia QCDR form completed.        

## 2017-02-24 NOTE — H&P (View-Only) (Signed)
02/13/2017 10:10 AM   Caleb Warren [birth year] [id number]  Referring provider: Leone Haven, MD [address] [place], [place] [address]  Chief Complaint  Patient presents with  . Hematuria    HPI: 56 yo WM who presents today to discuss CT urogram results performed to evaluate right flank pain associated with gross hematuria.  Background history Patient was a referral from Dr. Caryl Bis for kidney stones.   For the last month, patient has been experiencing right upper quadrant pain that radiates to the right groin area.  He has seeing blood in his urine from time to time.  Pain 2-3/10.  Nothing helps the pain.  Nothing makes the pain worse.  He has a history of kidney stones.  He states that his stone was cystine in composition.  He passes his stones spontaneously.  Had a metabolic work up years ago.  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   He has not had any recent imaging.  He has not seen an urologist in a number of years.  His last urologist was in [place].  He is a former smoker.  Quit in 1990.  Pack a day smoker.  No second hand smoke exposure.  He was in the [employer].  No GU cancers in the family.    CT urogram performed on 01/26/2017 noted bilateral nephrolithiasis including a dominant 1.6 cm nonobstructing stone in the right renal pelvis. There is some mild peripelvic edema associated. No ureteral or bladder stones. Otherwise no acute findings in the abdomen or pelvis.  Specifically, the terminal ileum and the appendix are normal.  Abdominal Aortic Atherosclerois (ICD10-170.0).  I have independently reviewed the films.  Today, he is still having right flank discomfort.  He states it can reach levels of 5/10.  He is not having fevers, chills,    PMH: Past Medical History:  Diagnosis Date  . Chicken pox   . Diabetes ([place])   . Diverticulitis   . GERD (gastroesophageal reflux disease)   . Hyperlipidemia   . Hypertension   .  Kidney stone     Surgical History: Past Surgical History:  Procedure Laterality Date  . KIDNEY STONE SURGERY  1994    Home Medications:  Allergies as of 02/13/2017   No Known Allergies     Medication List       Accurate as of 02/13/17 10:10 AM. Always use your most recent med list.          aspirin 81 MG tablet Take 81 mg by mouth daily.   atorvastatin 40 MG tablet Commonly known as:  LIPITOR Take 1 tablet (40 mg total) by mouth daily.   lisinopril 20 MG tablet Commonly known as:  PRINIVIL,ZESTRIL Take by mouth.   meloxicam 15 MG tablet Commonly known as:  MOBIC Take 1 tablet (15 mg total) by mouth daily.   metFORMIN 500 MG tablet Commonly known as:  GLUCOPHAGE Take 1 tablet (500 mg total) by mouth 2 (two) times daily with a meal.   omeprazole 20 MG capsule Commonly known as:  PRILOSEC Take 1 capsule (20 mg total) by mouth as needed.   Vitamin D3 2000 units capsule Take by mouth.       Allergies: No Known Allergies  Family History: Family History  Problem Relation Age of Onset  . Hyperlipidemia Mother   . Hypertension Mother   . Hyperlipidemia Father   . Hypertension Father   . Arthritis Maternal Grandmother   .  Cancer Maternal Grandmother     breast  . Arthritis Maternal Grandfather   . Cancer Paternal Uncle     colon  . Prostate cancer Neg Hx   . Kidney cancer Neg Hx   . Bladder Cancer Neg Hx     Social History:  reports that he quit smoking about 28 years ago. He has never used smokeless tobacco. He reports that he drinks alcohol. He reports that he does not use drugs.  ROS: UROLOGY Frequent Urination?: No Hard to postpone urination?: No Burning/pain with urination?: No Get up at night to urinate?: No Leakage of urine?: No Urine stream starts and stops?: No Trouble starting stream?: No Do you have to strain to urinate?: No Blood in urine?: No Urinary tract infection?: No Sexually transmitted disease?: No Injury to kidneys or  bladder?: No Painful intercourse?: No Weak stream?: No Erection problems?: No Penile pain?: No  Gastrointestinal Nausea?: No Vomiting?: No Indigestion/heartburn?: No Diarrhea?: No Constipation?: No  Constitutional Fever: No Night sweats?: No Weight loss?: No Fatigue?: No  Skin Skin rash/lesions?: No Itching?: No  Eyes Blurred vision?: No Double vision?: No  Ears/Nose/Throat Sore throat?: No Sinus problems?: No  Hematologic/Lymphatic Swollen glands?: No Easy bruising?: No  Cardiovascular Leg swelling?: No Chest pain?: No  Respiratory Cough?: No Shortness of breath?: No  Endocrine Excessive thirst?: No  Musculoskeletal Back pain?: No Joint pain?: No  Neurological Headaches?: No Dizziness?: No  Psychologic Depression?: No Anxiety?: No  Physical Exam: BP (!) 163/97 (BP Location: Left Arm, Patient Position: Sitting, Cuff Size: Normal)   Pulse 68   Ht '5\' 6"'  (1.676 m)   Wt 205 lb 8 oz (93.2 kg)   BMI 33.17 kg/m   Constitutional: Well nourished. Alert and oriented, No acute distress. HEENT: [place] AT, moist mucus membranes. Trachea midline, no masses. Cardiovascular: No clubbing, cyanosis, or edema. Respiratory: Normal respiratory effort, no increased work of breathing. GI: Abdomen is soft, non tender, non distended, no abdominal masses. Liver and spleen not palpable.  No hernias appreciated.  Stool sample for occult testing is not indicated.   GU: No CVA tenderness.  No bladder fullness or masses.  Genital and rectal deferred by patient.  Recently examined by PCP.  Skin: No rashes, bruises or suspicious lesions. Lymph: No cervical or inguinal adenopathy. Neurologic: Grossly intact, no focal deficits, moving all 4 extremities. Psychiatric: Normal mood and affect.  Laboratory Data: Lab Results  Component Value Date   WBC 6.4 08/03/2016   HGB 15.5 01/12/2015   HCT 45.1 08/03/2016   MCV 89 08/03/2016   PLT 196 08/03/2016    Lab Results    Component Value Date   CREATININE 0.90 01/26/2017     Lab Results  Component Value Date   HGBA1C 8.1 (H) 08/03/2016    Lab Results  Component Value Date   TSH 2.780 08/03/2016       Component Value Date/Time   CHOL 234 (H) 08/03/2016 0800   HDL 32 (L) 08/03/2016 0800   CHOLHDL 7.3 (H) 08/03/2016 0800   LDLCALC Comment 08/03/2016 0800    Lab Results  Component Value Date   AST 24 08/03/2016   Lab Results  Component Value Date   ALT 35 08/03/2016    Urinalysis 3-10 RBC's.   See EPIC.    Pertinent imaging CLINICAL DATA:  Right lower quadrant pain  EXAM: CT ABDOMEN AND PELVIS WITHOUT AND WITH CONTRAST  TECHNIQUE: Multidetector CT imaging of the abdomen and pelvis was performed following the standard  protocol before and following the bolus administration of intravenous contrast.  CONTRAST:  16m ISOVUE-300 IOPAMIDOL (ISOVUE-300) INJECTION 61%  COMPARISON:  None.  FINDINGS: Lower chest:  Unremarkable.  Hepatobiliary: No focal abnormality within the liver parenchyma. There is no evidence for gallstones, gallbladder wall thickening, or pericholecystic fluid. No intrahepatic or extrahepatic biliary dilation.  Pancreas: No focal mass lesion. No dilatation of the main duct. No intraparenchymal cyst. No peripancreatic edema.  Spleen: No splenomegaly. No focal mass lesion.  Adrenals/Urinary Tract: No adrenal nodule or mass.  1.5 x 1.2 x 1.6 cm nonobstructing stone is identified in the right renal pelvis. There is some subtle peripelvic edema associated, but no hydronephrosis. A tiny 1-2 mm nonobstructing interpolar right renal stone is associated.  Four stones are seen in the left kidney without hydronephrosis. These range in size from 1-2 mm up to about 6 mm.  No ureteral or bladder stones.  Imaging after IV contrast administration shows no enhancing lesion in either kidney.  Delayed imaging shows no wall thickening or soft tissue  filling defect in either intrarenal collecting system or renal pelvis. Ureters have normal imaging features bilaterally. No focal bladder wall abnormality.  Stomach/Bowel: Stomach is nondistended. No gastric wall thickening. No evidence of outlet obstruction. Duodenum is normally positioned as is the ligament of Treitz. No small bowel wall thickening. No small bowel dilatation. The terminal ileum is normal. The appendix is normal. Diverticular changes are noted in the left colon without evidence of diverticulitis.  Vascular/Lymphatic: There is abdominal aortic atherosclerosis without aneurysm. There is no gastrohepatic or hepatoduodenal ligament lymphadenopathy. No intraperitoneal or retroperitoneal lymphadenopathy.  Reproductive: The prostate gland and seminal vesicles have normal imaging features.  Other: No intraperitoneal free fluid.  Musculoskeletal: Bone windows reveal no worrisome lytic or sclerotic osseous lesions.  IMPRESSION: 1. Bilateral nephrolithiasis including a dominant 1.6 cm nonobstructing stone in the right renal pelvis. There is some mild peripelvic edema associated. No ureteral or bladder stones. 2. Otherwise no acute findings in the abdomen or pelvis. Specifically, the terminal ileum and the appendix are normal. 3.  Abdominal Aortic Atherosclerois (ICD10-170.0)   Electronically Signed   By: EMisty StanleyM.D.   On: 01/26/2017 10:46  Patient will undergo right percutaneous lithotripsy for definitive treatment of a 1.6 cm right renal pelvic stone.  Assessment & Plan:    1. Gross hematuria  - CT Urogram completed with positive findings for 1.6 cm right renal pelvic stone with bilateral nephrolithiasis  2. Right renal pelvic stone  - discussed treatment approaches to renal stones including observation, medical expulsive therapy, ureteroscopic extraction, shockwave lithotripsy and percutaneous nephrolithotomy   - advised against observation and  MET as the stone is very large  - advised against ESWL as the stone's density almost reaches 1500 HU and efficacy would be poor with a high likely hood of [place]  - discussed PCNL vs URS and patient would like to pursue PCNL  - Patient is advised of the risks of PCNL, such as: Bleeding: Blood loss during PCNL is generally minimal, and the risk of blood transfusion ranges from 2-12%. Infection: Bacteria can at times grow within stones and therefore causing urinary tract infection and rarely sepsis during stone surgery, adjacent tissue organ injury: Rarely organ surrounding the kidney such as bowel, colon, blood vessels, spleen, and liver may be injured during surgery requiring emergent open surgery or further surgery. The chest cavity is in close proximity to the upper pole of the kidney and can be axillae entered  when accessing and upper pole kidney stone resulting in a pneumothorax. This may require the small chest tube be placed temporarily to drain air and fluid from around the lung. Permanent damage to the kidney during PCNL resulting in loss of kidneys extremely rare. Damage and perforation to the ureter draining the kidney may result in scarring and obstruction requiring further surgery. Failure to remove the stone: Despite placement of 1 or more tracks into the kidney to remove stones, there is a small chance that PCNL may not be able to successfully remove all the stone as result of either size, number or location of stone within the collecting system. Additional treatment may be required.  - I also explained the risks of general anesthesia, such as: MI, CVA, paralysis, coma and/or death.  - UA  - Urine culture  - BMP  - CBC  - Advised to contact our office or seek treatment in the ED if becomes febrile or pain/ vomiting are difficult control in order to arrange for emergent/urgent intervention  3. Right hydronephrosis/edema  - obtain RUS right renal stone has been eradicated to ensure no  iatrogenic hydronephrosis persists   4. Right flank pain  - likely due to the right renal pelvic stone  5. History of nephrolithiasis  - offered 24 hour metabolic work up once offending stone is addressed     Return for Right PCNL .  These notes generated with voice recognition software. I apologize for typographical errors.  Zara Council, [facility] [address] [place], [place] [address] [phone]

## 2017-02-24 NOTE — Telephone Encounter (Signed)
-----   Message from Hildred Laser, MD sent at 02/24/2017 12:53 PM EDT ----- Patient needs to see me in 2 weeks for cyst stent removal. He needs a KUB prior. Thanks.

## 2017-02-24 NOTE — Op Note (Signed)
Date of procedure: 02/24/17  Preoperative diagnosis:  1. Right renal calculus   Postoperative diagnosis:  1. Same   Procedure: 1. Right percutaneous nephrolithotomy 2. Right antegrade nephrostogram with interpretation 3. Right antegrade 6 French by 26 cm double-J ureteral stent placement 4. Right percutaneous nephrostomy exchange  Surgeon: Baruch Gouty, MD  Anesthesia: General  Complications: None  Intraoperative findings: The patient had an approximately 2 cm right renal calculus that was very dense in nature. It was broken into small pieces the lithotripter and removed through the percutaneous nephrolithotomy tract.  EBL: 50 cc  Specimens: Right renal stone  Drains: 16 French Foley catheter, 6 [place] by 26 cm right double-J ureteral stent, right nephrostomy tube (20 [place] council catheter with 3 cc in balloon)  Disposition: Stable to the postanesthesia care unit  Indication for procedure: The patient is a 56 y.o. male with history of nephrolithiasis who presents today for right percutaneous nephrolithotomy for stone in his approximate 2 cm and very dense on imaging.  After reviewing the management options for treatment, the patient elected to proceed with the above surgical procedure(s). We have discussed the potential benefits and risks of the procedure, side effects of the proposed treatment, the likelihood of the patient achieving the goals of the procedure, and any potential problems that might occur during the procedure or recuperation. Informed consent has been obtained.  Description of procedure: The patient was met in the preoperative area. All risks, benefits, and indications of the procedure were described in great detail. The patient consented to the procedure. Preoperative antibiotics were given. The patient was taken to the operative theater. General anesthesia was induced per the anesthesia service. A Foley catheter was placed per the nursing service. The patient  was then placed in the prone position. All pressure points were adequately padded. He was then prepped and draped in the usual sterile fashion. A preoperative timeout was called.   The patient had a right nephroureteral catheter previously placed by interventional radiology. Using this access, a superstiff guidewire was then advanced through to the level of the bladder confirmed with fluoroscopy. Nephroureteral catheter was then removed. With the aid of the dual-lumen catheter, right antegrade nephrostogram was obtained and then a second sensor wire for safety purposes was placed alongside the superstiff wire to the level of the bladder and fluoroscopy. The balloon dilator was then placed over the Super Stiff wire and advanced in the renal pelvis under fluoroscopy. The balloon was inflated to 18 mmHg and left in place for 10 minutes to obtain hemostasis. The access sheath was then advanced over the balloon under fluoroscopy and the balloon was then deflated. The nephroscope was then introduced into the right renal pelvis where the approximate 2 cm stone was then seen. Using the lithotripter, was fragmented and broken into small pieces as well as suctioning the smaller pieces out with the lithotripter. The remaining larger segments were then removed with graspers through the access sheath. Pan nephroscopy then revealed no stones within the renal pelvis or in either of the 3 poles of the right kidney. At this point, he was deemed to be stone free. A right antegrade ureteral stent was then placed over the Super Stiff guidewire. The guidewire was then removed. A curl was in the patient's bladder under fluoroscopy in the renal pelvis under direct visualization. The access sheath was then removed. Over the remaining sensor wire a 20 French [place] catheter was advanced into the right renal pelvis. A right antegrade nephrostogram was obtained  which showed no filling defects and confirmed the catheter to be in the right  spot. 10 cc was placed in the balloon. The sensor was removed. The catheter was secured to the skin with a 2-0 silk. The patient was woke from anesthesia and transferred in stable condition to postanesthesia care unit.  Plan: The patient will be admitted to the floor overnight. His nephrostomy tube capped 5 mL the morning. If his pain is controlled, his Foley catheter and nephrostomy tube will be removed tomorrow. He'll then follow-up with me in 2 weeks for KUB and right ureteral stent removal.  Baruch Gouty, M.D.

## 2017-02-24 NOTE — Anesthesia Procedure Notes (Addendum)
Procedure Name: Intubation Performed by: Lance Muss Pre-anesthesia Checklist: Patient identified, Patient being monitored, Timeout performed, Emergency Drugs available and Suction available Patient Re-evaluated:Patient Re-evaluated prior to inductionOxygen Delivery Method: Circle system utilized Preoxygenation: Pre-oxygenation with 100% oxygen Intubation Type: IV induction Ventilation: Mask ventilation without difficulty Laryngoscope Size: McGraph and 4 Grade View: Grade II Tube type: Oral Tube size: 7.5 mm Number of attempts: 1 Airway Equipment and Method: Stylet,  Bougie stylet,  Video-laryngoscopy and LTA kit utilized Placement Confirmation: ETT inserted through vocal cords under direct vision,  positive ETCO2 and breath sounds checked- equal and bilateral Secured at: 21 cm Tube secured with: Tape Dental Injury: Teeth and Oropharynx as per pre-operative assessment  Difficulty Due To: Difficult Airway- due to anterior larynx Future Recommendations: Recommend- induction with short-acting agent, and alternative techniques readily available

## 2017-02-24 NOTE — Telephone Encounter (Signed)
Patient needs an order for a KUB please per dr. Sherryl Barters. He has a post op appt on 03-10-17   Thanks.  Marcelino Duster

## 2017-02-24 NOTE — Transfer of Care (Signed)
Immediate Anesthesia Transfer of Care Note  Patient: Caleb Warren  Procedure(s) Performed: Procedure(s): NEPHROLITHOTOMY PERCUTANEOUS (Right)  Patient Location: PACU  Anesthesia Type:General  Level of Consciousness: sedated and responds to stimulation  Airway & Oxygen Therapy: Patient Spontanous Breathing and Patient connected to face mask oxygen  Post-op Assessment: Report given to RN and Post -op Vital signs reviewed and stable  Post vital signs: Reviewed and stable  Last Vitals:  Vitals:   02/24/17 0950 02/24/17 1250  BP: (!) 178/113 (!) 148/96  Pulse: 82 84  Resp: 20   Temp: 36.9 C (!) 36.1 C    Last Pain:  Vitals:   02/24/17 0950  TempSrc: Oral  PainSc: 7       Patients Stated Pain Goal: 2 (02/24/17 0950)  Complications: No apparent anesthesia complications

## 2017-02-25 DIAGNOSIS — N132 Hydronephrosis with renal and ureteral calculous obstruction: Secondary | ICD-10-CM | POA: Diagnosis not present

## 2017-02-25 LAB — BASIC METABOLIC PANEL
Anion gap: 3 — ABNORMAL LOW (ref 5–15)
BUN: 14 mg/dL (ref 6–20)
CHLORIDE: 106 mmol/L (ref 101–111)
CO2: 30 mmol/L (ref 22–32)
Calcium: 8.1 mg/dL — ABNORMAL LOW (ref 8.9–10.3)
Creatinine, Ser: 1.21 mg/dL (ref 0.61–1.24)
GFR calc Af Amer: >60 mL/min (ref >60)
GLUCOSE: 130 mg/dL — AB (ref 65–99)
POTASSIUM: 4.6 mmol/L (ref 3.5–5.1)
Sodium: 139 mmol/L (ref 135–145)

## 2017-02-25 LAB — CBC
HEMATOCRIT: 39.4 % — AB (ref 40.0–52.0)
HEMOGLOBIN: 13.2 g/dL (ref 13.0–18.0)
MCH: 31.1 pg (ref 26.0–34.0)
MCHC: 33.6 g/dL (ref 32.0–36.0)
MCV: 92.4 fL (ref 80.0–100.0)
Platelets: 177 10*3/uL (ref 150–440)
RBC: 4.26 MIL/uL — ABNORMAL LOW (ref 4.40–5.90)
RDW: 13.6 % (ref 11.5–14.5)
WBC: 10.5 10*3/uL (ref 3.8–10.6)

## 2017-02-25 LAB — GLUCOSE, CAPILLARY
GLUCOSE-CAPILLARY: 130 mg/dL — AB (ref 65–99)
GLUCOSE-CAPILLARY: 154 mg/dL — AB (ref 65–99)
GLUCOSE-CAPILLARY: 184 mg/dL — AB (ref 65–99)
Glucose-Capillary: 130 mg/dL — ABNORMAL HIGH (ref 65–99)
Glucose-Capillary: 99 mg/dL (ref 65–99)

## 2017-02-25 MED ORDER — DEXTROSE 5 % IV SOLN
2.0000 g | INTRAVENOUS | Status: DC
  Administered 2017-02-25 – 2017-02-26 (×2): 2 g via INTRAVENOUS
  Filled 2017-02-25 (×2): qty 2

## 2017-02-25 MED ORDER — CEFTRIAXONE SODIUM-DEXTROSE 2-2.22 GM-% IV SOLR
2.0000 g | INTRAVENOUS | Status: DC
  Filled 2017-02-25: qty 50

## 2017-02-25 MED ORDER — DEXTROSE 5 % IV SOLN: 2.0000 g | INTRAVENOUS | Status: DC

## 2017-02-25 NOTE — Progress Notes (Signed)
1 Day Post-Op  Subjective:  1 - Large Right Renal Stone - s/p RIGHT percutaneous nephrostolithotomy and antegrade JJ stent placement on 02/24/17 the day of admission. Foley removed and neph tube clamped POD 1.   2 - Low Grade Fever - fever to 100 POD 0 after PCNL above. Pre-op UCX negative x several. New UCX obtained 4/7 and placed on empiric rocephin.  Today "Caleb Warren" is stable. Low grade fever overnight, no tachycardia / hypotension / severe malaise. Pain controlled.   Objective: Vital signs in last 24 hours: Temp:  [98.8 F (37.1 C)-100.2 F (37.9 C)] 98.8 F (37.1 C) (04/07 1300) Pulse Rate:  [92-100] 92 (04/07 1300) Resp:  [18-20] 18 (04/07 1300) BP: (125-131)/(69-79) 128/70 (04/07 1300) SpO2:  [95 %-97 %] 97 % (04/07 1300)    Intake/Output from previous day: 04/06 0701 - 04/07 0700 In: 2697 [I.V.:2447; IV Piggyback:250] Out: 1655 [Urine:1605; Blood:50] Intake/Output this shift: Total I/O In: 480 [P.O.:480] Out: 1500 [Urine:1500]  General appearance: alert, cooperative, appears stated age and wife at bedside Eyes: negative Nose: Nares normal. Septum midline. Mucosa normal. No drainage or sinus tenderness. Throat: lips, mucosa, and tongue normal; teeth and gums normal Neck: supple, symmetrical, trachea midline Back: symmetric, no curvature. ROM normal. No CVA tenderness. Resp: Non-labored on room air.  Cardio: Nl rate GI: soft, non-tender; bowel sounds normal; no masses,  no organomegaly and Mild truncal obesity.  Male genitalia: normal, foley in situe with medium pink urine. Removed.  Extremities: extremities normal, atraumatic, no cyanosis or edema Pulses: 2+ and symmetric Skin: Skin color, texture, turgor normal. No rashes or lesions Lymph nodes: Cervical, supraclavicular, and axillary nodes normal. Neurologic: Grossly normal Incision/Wound: Rt neph tube clamped. No flank hematomas.   Lab Results:   Recent Labs  02/23/17 1333 02/25/17 0448  WBC 8.9 10.5  HGB  16.6 13.2  HCT 48.2 39.4*  PLT 229 177   BMET  Recent Labs  02/23/17 1333 02/25/17 0448  NA 141 139  K 4.3 4.6  CL 105 106  CO2 27 30  GLUCOSE 103* 130*  BUN 13 14  CREATININE 0.90 1.21  CALCIUM 9.8 8.1*   PT/INR  Recent Labs  02/23/17 1333  LABPROT 12.6  INR 0.94   ABG No results for input(s): PHART, HCO3 in the last 72 hours.  Invalid input(s): PCO2, PO2  Studies/Results: Dg Abd 1 View  Result Date: 02/24/2017 CLINICAL DATA:  Renal stones. EXAM: ABDOMEN - 1 VIEW COMPARISON:  CT 01/26/2017. FINDINGS: Nephrostomy tube noted in the imaged renal collecting system, most likely the right side. Ureteral catheter noted. Debris noted in the renal collecting system and proximal ureter. One image obtained. 5 minutes 18 seconds fluoroscopy time utilized. IMPRESSION: Nephrostomy tube noted in the right renal collecting system. Ureteral catheter noted. Debris noted in the renal collecting system and proximal ureter . Electronically Signed   By: Maisie Fus  Register   On: 02/24/2017 13:08   Dg C-arm 1-60 Min-no Report  Result Date: 02/24/2017 Fluoroscopy was utilized by the requesting physician.  No radiographic interpretation.    Anti-infectives: Anti-infectives    Start     Dose/Rate Route Frequency Ordered Stop   02/25/17 0915  cefTRIAXone (ROCEPHIN) 2 g in dextrose 5 % 50 mL IVPB     2 g 100 mL/hr over 30 Minutes Intravenous Every 24 hours 02/25/17 0913     02/25/17 0900  cefTRIAXone (ROCEPHIN) IVPB 2 g  Status:  Discontinued    Comments:  UTI   2 g  100 mL/hr over 30 Minutes Intravenous Every 24 hours 02/25/17 0856 02/25/17 0913   02/25/17 0830  cefTRIAXone (ROCEPHIN) 2 g in dextrose 5 % 50 mL IVPB  Status:  Discontinued     2 g 100 mL/hr over 30 Minutes Intravenous Every 24 hours 02/25/17 0830 02/25/17 0855   02/24/17 1700  ceFAZolin (ANCEF) IVPB 2g/100 mL premix  Status:  Discontinued     2 g 200 mL/hr over 30 Minutes Intravenous Every 8 hours 02/24/17 1408 02/24/17 1431    02/24/17 1700  ceFAZolin (ANCEF) 2 g in dextrose 5 % 100 mL IVPB     2 g 200 mL/hr over 30 Minutes Intravenous Every 8 hours 02/24/17 1431 02/25/17 0630   02/24/17 0000  cephALEXin (KEFLEX) 500 MG capsule     500 mg Oral 3 times daily 02/24/17 1243     02/23/17 2215  ceFAZolin (ANCEF) IVPB 2g/100 mL premix     2 g 200 mL/hr over 30 Minutes Intravenous  Once 02/23/17 2211 02/24/17 1132      Assessment/Plan:   1 - Large Right Renal Stone - doing well POD 1. Dc foley, continue neph tube clamping, will likely remove tomorrow.   2 - Low Grade Fever - likely response to pre-formed endotoxin in stone. Low suspicion for active infection. Continue empiric rocephin and likely DC tomorrow as long as fever free x 24 hrs.   Goals for DC discussed with pt and wife who voiced understanding.    [facility], Gabriana Wilmott 02/25/2017

## 2017-02-26 DIAGNOSIS — N132 Hydronephrosis with renal and ureteral calculous obstruction: Secondary | ICD-10-CM | POA: Diagnosis not present

## 2017-02-26 LAB — GLUCOSE, CAPILLARY
GLUCOSE-CAPILLARY: 122 mg/dL — AB (ref 65–99)
GLUCOSE-CAPILLARY: 137 mg/dL — AB (ref 65–99)
Glucose-Capillary: 115 mg/dL — ABNORMAL HIGH (ref 65–99)
Glucose-Capillary: 123 mg/dL — ABNORMAL HIGH (ref 65–99)

## 2017-02-26 MED ORDER — CEPHALEXIN 500 MG PO CAPS: 500.0000 mg | ORAL_CAPSULE | Freq: Three times a day (TID) | ORAL | 0 refills | Status: DC

## 2017-02-26 MED ORDER — SENNOSIDES-DOCUSATE SODIUM 8.6-50 MG PO TABS: 1.0000 | ORAL_TABLET | Freq: Two times a day (BID) | ORAL | 0 refills | Status: DC

## 2017-02-26 MED ORDER — PANTOPRAZOLE SODIUM 40 MG PO TBEC
40.0000 mg | DELAYED_RELEASE_TABLET | ORAL | Status: DC
  Administered 2017-02-26: 40 mg via ORAL
  Filled 2017-02-26: qty 1

## 2017-02-26 NOTE — Discharge Summary (Signed)
Physician Discharge Summary  Patient ID: Caleb Warren MRN: [id number] DOB/AGE: [birth year] 56 y.o.  Admit date: 02/24/2017 Discharge date: 02/26/2017  Admission Diagnoses: Large Right Renal Stone  Discharge Diagnoses:  Active Problems:   Nephrolithiasis   Discharged Condition: good  Hospital Course:    1 - Large Right Renal Stone - s/p RIGHT percutaneous nephrostolithotomy and antegrade JJ stent placement on 02/24/17 the day of admission. Foley removed and neph tube clamped POD 1 and removed POD 2.   2 - Low Grade Fever - fever to 100 POD 0 after PCNL above. Pre-op UCX negative x several. New UCX obtained 4/7 and placed on empiric rocephin. He is afebrile x 24 hours at time of discharge.  By the afternoon of POD 2, he is ambulatory, pain controlled with PO meds, voding w/o issue, and felt to be adequate for discharge.       Consults: None  Significant Diagnostic Studies: labs: stone composition - pending  Treatments: surgery: RIGHT percutaneous nephrostolithotomy and antegrade JJ stent placement on 02/24/17   Discharge Exam: Blood pressure 140/79, pulse 89, temperature 98.7 F (37.1 C), temperature source Oral, resp. rate 20, height  (1.676 m), weight 93 kg (205 lb), SpO2 98 %. General appearance: alert, cooperative, appears stated age and wife at bedside Eyes: negative Nose: Nares normal. Septum midline. Mucosa normal. No drainage or sinus tenderness. Throat: lips, mucosa, and tongue normal; teeth and gums normal Neck: supple, symmetrical, trachea midline Back: symmetric, no curvature. ROM normal. No CVA tenderness. Resp: non-labored on room air.  Cardio: Nl rate GI: soft, non-tender; bowel sounds normal; no masses,  no organomegaly Male genitalia: normal Extremities: extremities normal, atraumatic, no cyanosis or edema Pulses: 2+ and symmetric Lymph nodes: Cervical, supraclavicular, and axillary nodes normal. Neurologic: Grossly normal Incision/Wound: Rt neph tube  removed. Pressure held x 3 min with complete resolusion of small venous ooze. Dry compressive dressing placed, remains c/d/I 15 min after placement.   Disposition: Final discharge disposition not confirmed   Allergies as of 02/26/2017      Reactions   Horseradish [cochlearia Armoracia] Swelling   Swelling of airway (throat closes)      Medication List    STOP taking these medications   acetaminophen 325 MG tablet Commonly known as:  TYLENOL   aspirin EC 81 MG tablet   ibuprofen 200 MG tablet Commonly known as:  ADVIL,MOTRIN   meloxicam 15 MG tablet Commonly known as:  MOBIC     TAKE these medications   atorvastatin 40 MG tablet Commonly known as:  LIPITOR Take 1 tablet (40 mg total) by mouth daily. What changed:  when to take this   cephALEXin 500 MG capsule Commonly known as:  KEFLEX Take 1 capsule (500 mg total) by mouth 3 (three) times daily. X 3 days now. Also take for 3 days starting day before next Urology appointment.   HYDROcodone-acetaminophen 5-325 MG tablet Commonly known as:  NORCO Take 1-2 tablets by mouth every 4 (four) hours as needed for moderate pain. What changed:  how much to take  when to take this   metFORMIN 500 MG tablet Commonly known as:  GLUCOPHAGE Take 1 tablet (500 mg total) by mouth 2 (two) times daily with a meal.   omeprazole 20 MG tablet Commonly known as:  PRILOSEC OTC Take 20 mg by mouth every 3 (three) days.   senna-docusate 8.6-50 MG tablet Commonly known as:  Senokot-S Take 1 tablet by mouth 2 (two) times daily. While taking strong  pain meds to prevent constipation.      Follow-up Information    Hildred Laser, MD On 03/10/2017.   Why:  Friday at 2:30pm and get your x-ray prior to appointment with Xray and stent removal Contact information: [address], [address] [place], [place] [address] [phone]          Signed: Sebastian Ache 02/26/2017, 1:27 PM

## 2017-02-26 NOTE — Discharge Instructions (Signed)
Follow all MD discharge instructions. Take all medications as prescribed. Keep all follow up appointments. If your symptoms return, call your doctor. If you experience any new symptoms that are of concern to you or that are bothersome to you, call your doctor. For all questions and/or concerns, call your doctor. ° °If you experience any bleeding or discharge from your incision sites, redness or swelling at your incision sites, fever, pain uncontrolled by medication, increase in pain, or any nausea, call your doctor.  ° ° If you have a medical emergency, call 911 °

## 2017-02-26 NOTE — Progress Notes (Signed)
Pt d/c home; d/c instructions reviewed w/ pt; pt understanding was verbalized; IV removed, catheter in tact, gauze dressing applied; dressing on pt back CDI at d/c; all pt questions answered; pt verbalized that all pt belongings were accounted for; pt left unit via wheelchair accompanied by staff

## 2017-02-27 ENCOUNTER — Telehealth: Payer: Self-pay

## 2017-02-27 ENCOUNTER — Encounter: Payer: Self-pay | Admitting: Urology

## 2017-02-27 ENCOUNTER — Other Ambulatory Visit
Admission: RE | Admit: 2017-02-27 | Discharge: 2017-02-27 | Disposition: A | Discharge status: Home / Self Care | Payer: Managed Care, Other (non HMO) | Source: Ambulatory Visit | Attending: Urology | Admitting: Urology

## 2017-02-27 ENCOUNTER — Ambulatory Visit (INDEPENDENT_AMBULATORY_CARE_PROVIDER_SITE_OTHER): Payer: Managed Care, Other (non HMO) | Admitting: Urology

## 2017-02-27 ENCOUNTER — Telehealth: Payer: Self-pay | Admitting: Urology

## 2017-02-27 ENCOUNTER — Ambulatory Visit
Admission: RE | Admit: 2017-02-27 | Discharge: 2017-02-27 | Disposition: A | Discharge status: Home / Self Care | Payer: Managed Care, Other (non HMO) | Source: Ambulatory Visit | Attending: Urology | Admitting: Urology

## 2017-02-27 VITALS — BP 138/89 | HR 78 | Temp 98.9°F | Ht 66.0 in | Wt 201.6 lb

## 2017-02-27 DIAGNOSIS — R310 Gross hematuria: Secondary | ICD-10-CM

## 2017-02-27 DIAGNOSIS — X58XXXA Exposure to other specified factors, initial encounter: Secondary | ICD-10-CM | POA: Diagnosis not present

## 2017-02-27 DIAGNOSIS — I700 Atherosclerosis of aorta: Secondary | ICD-10-CM | POA: Insufficient documentation

## 2017-02-27 DIAGNOSIS — N132 Hydronephrosis with renal and ureteral calculous obstruction: Secondary | ICD-10-CM | POA: Diagnosis not present

## 2017-02-27 DIAGNOSIS — N200 Calculus of kidney: Secondary | ICD-10-CM | POA: Diagnosis not present

## 2017-02-27 DIAGNOSIS — T148XXA Other injury of unspecified body region, initial encounter: Secondary | ICD-10-CM | POA: Insufficient documentation

## 2017-02-27 LAB — CREATININE, SERUM
Creatinine, Ser: 1.05 mg/dL (ref 0.61–1.24)
GFR calc non Af Amer: >60 mL/min (ref >60)

## 2017-02-27 LAB — URINE CULTURE: Culture: NO GROWTH

## 2017-02-27 LAB — BUN: BUN: 11 mg/dL (ref 6–20)

## 2017-02-27 LAB — HEMOGLOBIN: Hemoglobin: 14 g/dL (ref 13.0–18.0)

## 2017-02-27 MED ORDER — IOPAMIDOL (ISOVUE-300) INJECTION 61%
100.0000 mL | Freq: Once | INTRAVENOUS | Status: AC | PRN
  Administered 2017-02-27: 100 mL via INTRAVENOUS

## 2017-02-27 NOTE — Progress Notes (Signed)
Labs are for [facility] lab ordered stat. Also called hospital lab to let them know he was coming.

## 2017-02-27 NOTE — Telephone Encounter (Signed)
-----   Message from Harle Battiest, PA-C sent at 02/27/2017  2:21 PM EDT -----   ----- Message ----- From: Interface, Rad Results In Sent: 02/27/2017   2:19 PM To: Harle Battiest, PA-C

## 2017-02-27 NOTE — Telephone Encounter (Signed)
Pt wife presented to clinic stating pt had perc tube removed yesterday by Dr. Berneice Heinrich and was d/c home from hospital. Wife then stated that since being home he has developed a growing in size "mass" on side perc tube was placed. Per Dr. Apolinar Junes pt should be added to see a provider. Pt was added to [facility] and pt made aware.

## 2017-02-27 NOTE — Anesthesia Postprocedure Evaluation (Signed)
Anesthesia Post Note  Patient: Caleb Warren  Procedure(s) Performed: Procedure(s) (LRB): NEPHROLITHOTOMY PERCUTANEOUS (Right) CYSTOSCOPY WITH STENT PLACEMENT (Right)  Patient location during evaluation: PACU Anesthesia Type: General Level of consciousness: awake and alert Pain management: pain level controlled Vital Signs Assessment: post-procedure vital signs reviewed and stable Respiratory status: spontaneous breathing, nonlabored ventilation, respiratory function stable and patient connected to nasal cannula oxygen Cardiovascular status: blood pressure returned to baseline and stable Postop Assessment: no signs of nausea or vomiting Anesthetic complications: no     Last Vitals:  Vitals:   02/26/17 0547 02/26/17 1246  BP: 127/88 140/79  Pulse: 91 89  Resp: 16 20  Temp: 37.2 C 37.1 C    Last Pain:  Vitals:   02/26/17 1358  TempSrc:   PainSc: 4                  Cleda Mccreedy Piscitello

## 2017-02-27 NOTE — Progress Notes (Signed)
02/27/2017 8:17 PM   [name] [birth year] [id number]  Referring provider: Glori Luis, MD [address] [place], [place] [address]  Chief Complaint  Patient presents with  . Follow-up    patient having problem with perk tube    HPI: 56 yo WM who underwent PCNL on 02/24/2017 with Dr. Sherryl Barters for a right renal calculus who presents today requesting an urgent appointment for right sided swelling.    Patient's hospital stay was extended for one day for a low grade fever.  Urine culture from 02/25/2017 was negative.  His neph tube was removed on 02/26/2017 and he was discharged to home yesterday.  He started to notice a swelling in his right lower abdomen last evening.  He has not had high fevers, dizziness, light headeness, nausea or vomiting.     He has not any significant drainage from the PCNL site.    Background history Patient was a referral from Dr. Birdie Sons for kidney stones.   For the last month, patient has been experiencing right upper quadrant pain that radiates to the right groin area.  He has seeing blood in his urine from time to time.  Pain 2-3/10.  Nothing helps the pain.  Nothing makes the pain worse.  He has a history of kidney stones.  He states that his stone was cystine in composition.  He passes his stones spontaneously.  Had a metabolic work up years ago.  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   He has not had any recent imaging.  He has not seen an urologist in a number of years.  His last urologist was in [place].  He is a former smoker.  Quit in 1990.  Pack a day smoker.  No second hand smoke exposure.  He was in the [employer].  No GU cancers in the family.   CT urogram performed on 01/26/2017 noted bilateral nephrolithiasis including a dominant 1.6 cm nonobstructing stone in the right renal pelvis. There is some mild peripelvic edema associated. No ureteral or bladder stones. Otherwise no acute findings in the abdomen  or pelvis.  Specifically, the terminal ileum and the appendix are normal.  Abdominal Aortic Atherosclerois (ICD10-170.0).    PMH: Past Medical History:  Diagnosis Date  . Arthritis    HANDS  . Chicken pox   . Diabetes (HCC)   . Difficult intubation   . Diverticulitis   . GERD (gastroesophageal reflux disease)   . History of kidney stones   . Hyperlipidemia     Surgical History: Past Surgical History:  Procedure Laterality Date  . COLONOSCOPY  2016  . CYSTOSCOPY WITH STENT PLACEMENT Right 02/24/2017   Procedure: CYSTOSCOPY WITH STENT PLACEMENT;  Surgeon: Hildred Laser, MD;  Location: [facility] ORS;  Service: Urology;  Laterality: Right;  . IR NEPHROSTOMY PLACEMENT RIGHT  02/23/2017  . KIDNEY STONE SURGERY  1994  . 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 Always use your most recent med list.          atorvastatin 40 MG tablet Commonly known as:  LIPITOR Take 1 tablet (40 mg total) by mouth daily.   cephALEXin 500 MG capsule Commonly known as:  KEFLEX Take 1 capsule (500 mg total) by mouth 3 (three) times daily. X 3 days now. Also take for 3 days starting day before next Urology appointment.   HYDROcodone-acetaminophen 5-325 MG tablet Commonly known as:  NORCO Take 1-2 tablets by mouth every 4 (four) hours as needed for moderate pain.   metFORMIN 500 MG tablet Commonly known as:  GLUCOPHAGE Take 1 tablet (500 mg total) by mouth 2 (two) times daily with a meal.   omeprazole 20 MG tablet Commonly known as:  PRILOSEC OTC Take 20 mg by mouth every 3 (three) days.   senna-docusate 8.6-50 MG tablet Commonly known as:  Senokot-S Take 1  tablet by mouth 2 (two) times daily. While taking strong pain meds to prevent constipation.       Allergies:  Allergies  Allergen Reactions  . 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 Hyperlipidemia Mother   . Hypertension Mother   . Hyperlipidemia Father   . Hypertension Father   . Arthritis Maternal Grandmother   . Cancer Maternal Grandmother     breast  . Arthritis Maternal Grandfather   . Cancer Paternal Uncle     colon  . Prostate cancer Neg Hx   . Kidney cancer Neg Hx   . Bladder Cancer Neg Hx     Social History:  reports that he quit smoking about 28 years ago. His smoking use included Cigarettes. He has a 2.50 pack-year smoking history. He has never used smokeless tobacco. He reports that he drinks alcohol. He reports that he does not use drugs.  ROS: UROLOGY Frequent Urination?: No Hard to postpone urination?: No Burning/pain with urination?: Yes Get up at night to urinate?: No Leakage of urine?: No Urine stream starts and stops?: No Trouble starting stream?: No Do you have to strain to urinate?: No Blood in urine?: Yes Urinary tract infection?: No Sexually transmitted disease?: No Injury to kidneys or bladder?: No Painful intercourse?: No Weak stream?: No Erection problems?: No Penile pain?: No  Gastrointestinal Nausea?: No Vomiting?: No Indigestion/heartburn?: No Diarrhea?: No Constipation?: No  Constitutional Fever: No Night sweats?: No Weight loss?: No Fatigue?: No  Skin Skin rash/lesions?: No Itching?: Yes  Eyes Blurred vision?: No Double vision?: No  Ears/Nose/Throat Sore throat?: No Sinus problems?: No  Hematologic/Lymphatic Swollen glands?: No Easy bruising?: No  Cardiovascular Leg swelling?: No Chest pain?: No  Respiratory Cough?: No Shortness of breath?: No  Endocrine Excessive thirst?:  No  Musculoskeletal Back pain?: No Joint pain?: No  Neurological Headaches?: No Dizziness?: No  Psychologic Depression?: No Anxiety?: No  Physical Exam: BP 138/89   Pulse 78   Temp 98.9 F (37.2 C) (Oral)   Ht  (1.676 m)   Wt 201 lb 9.6 oz (91.4 kg)   BMI 32.54 kg/m   Constitutional: Well nourished. Alert and oriented, No acute distress. HEENT: [place] AT, moist mucus membranes. Trachea midline, no masses. Cardiovascular: No clubbing, cyanosis, or edema. Respiratory: Normal respiratory effort, no increased work of breathing. GI: Abdomen is soft, non tender, non distended, no abdominal masses. Liver and spleen not palpable.  No hernias appreciated.  Stool sample for occult testing is not indicated.   Palpable swelling in the right lower quadrant.  No ecchymosis.  Dressings clean and dry.  Dressings removed and wound is clean and dry.  No drainage when palpating the area around the site.  New dressings applied   GU: Mild right CVA tenderness.  No bladder fullness or masses.   Skin: No rashes, bruises or suspicious lesions. Lymph: No cervical or inguinal adenopathy. Neurologic: Grossly intact, no focal deficits, moving all 4 extremities. Psychiatric: Normal mood and affect.  Laboratory Data: Lab Results  Component Value Date   WBC 10.5 02/25/2017   HGB 14.0 02/27/2017   HCT 39.4 (L) 02/25/2017   MCV 92.4 02/25/2017   PLT 177 02/25/2017    Lab Results  Component Value Date   CREATININE 1.05 02/27/2017     Lab Results  Component Value Date   HGBA1C 8.1 (H) 08/03/2016    Lab Results  Component Value Date   TSH 2.780 08/03/2016       Component Value Date/Time   CHOL 234 (H) 08/03/2016 0800   HDL 32 (L) 08/03/2016 0800   CHOLHDL 7.3 (H) 08/03/2016 0800   LDLCALC Comment 08/03/2016 0800    Lab Results  Component Value Date   AST 24 08/03/2016   Lab Results  Component Value Date   ALT 35 08/03/2016    Pertinent imaging CLINICAL DATA:  Interval  removal of nephrostomy tubes. Swelling and bruising at nephrostomy tube site.  EXAM: CT ABDOMEN AND PELVIS WITH CONTRAST  TECHNIQUE: Multidetector CT imaging of the abdomen and pelvis was performed using the standard protocol following bolus administration of intravenous contrast.  CONTRAST:  ISOVUE-300 IOPAMIDOL (ISOVUE-300) INJECTION 61%  COMPARISON:  01/26/2017  FINDINGS: Lower chest: Very small right pleural effusion is identified. The lung bases are otherwise clear  Hepatobiliary: Mild diffuse hepatic steatosis. No suspicious liver abnormality.Gallbladder appears normal. No biliary dilatation.  Pancreas: Unremarkable. No pancreatic ductal dilatation or surrounding inflammatory changes.  Spleen: Normal in size without focal abnormality.  Adrenals/Urinary Tract: Adrenal glands are unremarkable. The adrenal glands are unremarkable. Several small low-density lesions within the left kidney are far too small to reliably characterize. Several small left renal calculi are noted. The largest is in the inferior pole measuring 4 mm, image 41 of series 2.  There stent removal of the right-sided percutaneous nephrostomy tube with postprocedural changes involving the posterior and lateral lower pole of the right kidney. Small subcapsular fluid collection overlying the anterolateral right kidney measures 0.5 x 2.9 x 4.5 cm. Several small stone fragments are identified within the right renal collecting system. The right-sided ureteral stent is in place without significant hydronephrosis. No large perinephric fluid collections or urinoma is identified. The right renal vein remains patent.  Stomach/Bowel: Stomach is within normal limits. Appendix appears normal. No evidence of bowel wall thickening, distention, or inflammatory changes.  Vascular/Lymphatic: Aortic atherosclerosis. No enlarged abdominal or pelvic lymph nodes.  Reproductive: Prostate is  unremarkable.  Other: No abdominal wall hernia or abnormality. No abdominopelvic ascites.  Musculoskeletal: No acute or significant osseous findings.  IMPRESSION: 1. There are postprocedural changes involving the inferior pole of the right kidney compatible with recent nephrostomy and percutaneous nephrolithotomy. No significant complication identified. No abdominal wall fluid collection or mass identified. 2. Several small stone fragments are identified within the right renal collecting system. 3. Small right-sided subcapsular fluid collection. 4. Aortic atherosclerosis.   Electronically Signed   By: Signa Kell M.D.   On: 02/27/2017 14:17  Patient is s/p right percutaneous lithotripsy for definitive treatment of a 1.6 cm right renal pelvic stone.    Assessment & Plan:  1. S/P right PCNL  - Small right-sided subcapsular fluid collection seen on CT - HBG stable at 14.0   2. Gross hematuria  - We will continue to monitor the patient's UA after the treatment/passage of the stone to ensure the hematuria has resolved.  If hematuria persists, we will pursue a hematuria workup with CT Urogram and cystoscopy if appropriate.  3. Right renal pelvic stone  - s/p PCNL  3. Right hydronephrosis/edema  - obtain RUS right renal stone has been eradicated to ensure no iatrogenic hydronephrosis persists after recovered from PCNL  4. History of nephrolithiasis  - offered 24 hour metabolic work up once offending stone is addressed     Return for Dr. Apolinar Junes spoke with patient.  These notes generated with voice recognition software. I apologize for typographical errors.  Michiel Cowboy, PA-C  [facility] [address], [address] [place], [place] [address] [phone]

## 2017-02-27 NOTE — Telephone Encounter (Signed)
Orders placed.

## 2017-02-27 NOTE — Telephone Encounter (Signed)
Called [name] to discuss findings. No evidence of significant retroperitoneal hematoma. Hemoglobin is stable. Recommend follow-up as scheduled.  Vanna Scotland, MD

## 2017-02-28 ENCOUNTER — Ambulatory Visit: Payer: Managed Care, Other (non HMO)

## 2017-03-06 LAB — STONE ANALYSIS
CA OXALATE, MONOHYDR.: 98 %
CA PHOS CRY STONE QL IR: 2 %
Stone Weight KSTONE: 988.1 mg

## 2017-03-10 ENCOUNTER — Ambulatory Visit (INDEPENDENT_AMBULATORY_CARE_PROVIDER_SITE_OTHER): Payer: Managed Care, Other (non HMO) | Admitting: Urology

## 2017-03-10 ENCOUNTER — Ambulatory Visit: Payer: Managed Care, Other (non HMO)

## 2017-03-10 ENCOUNTER — Encounter: Payer: Self-pay | Admitting: Urology

## 2017-03-10 VITALS — BP 130/83 | HR 80 | Ht 66.0 in | Wt 194.0 lb

## 2017-03-10 DIAGNOSIS — N200 Calculus of kidney: Secondary | ICD-10-CM

## 2017-03-10 LAB — URINALYSIS, COMPLETE
Bilirubin, UA: NEGATIVE
GLUCOSE, UA: NEGATIVE
KETONES UA: NEGATIVE
Nitrite, UA: NEGATIVE
SPEC GRAV UA: 1.025 (ref 1.005–1.030)
UUROB: 0.2 mg/dL (ref 0.2–1.0)
pH, UA: 5 (ref 5.0–7.5)

## 2017-03-10 LAB — MICROSCOPIC EXAMINATION: RBC, UA: >30 /hpf — ABNORMAL HIGH (ref 0–?)

## 2017-03-10 MED ORDER — CIPROFLOXACIN HCL 500 MG PO TABS
500.0000 mg | ORAL_TABLET | Freq: Once | ORAL | Status: AC
  Administered 2017-03-10: 500 mg via ORAL

## 2017-03-10 MED ORDER — LIDOCAINE HCL 2 % EX GEL
1.0000 "application " | Freq: Once | CUTANEOUS | Status: AC
  Administered 2017-03-10: 1 via URETHRAL

## 2017-03-10 NOTE — Progress Notes (Signed)
   03/10/17  CC: No chief complaint on file.   HPI: The patient is a 56 year old gentleman who underwent a right percutaneous nephrolithotomy on 02/24/2017. His postoperative course was complicated by low-grade fever with negative urine cultures. He also returned to clinic shortly after discharge with severe right flank pain. A CT scan was performed which showed a small subcapsular fluid collection around his right but was otherwise normal. His ureteral stent was in place. There were no stones along the right ureter on this examination. He had a couple small fragments less than 2 mm remaining in his right kidney.  There were no vitals taken for this visit. NED. A&Ox3.   No respiratory distress   Abd soft, NT, ND Normal phallus with bilateral descended testicles  Cystoscopy Procedure Note  Patient identification was confirmed, informed consent was obtained, and patient was prepped using Betadine solution.  Lidocaine jelly was administered per urethral meatus.    Preoperative abx where received prior to procedure.     Pre-Procedure: - Inspection reveals a normal caliber ureteral meatus.  Procedure: The flexible cystoscope was introduced without difficulty - No urethral strictures/lesions are present. - Right ureteral stent removed per meatus with flexible graspers  Post-Procedure: - Patient tolerated the procedure well  Assessment/ Plan:  1. Right renal stone The patient will follow-up in one month with a renal ultrasound prior to rule out iatrogenic hydronephrosis. We will go over her stone analysis at that time. We may need to consider 24 urine studies. He also has small bilateral renal stones that'll need to be watched regrowth with KUB in the future (visible on CT scouts)

## 2017-03-30 ENCOUNTER — Ambulatory Visit
Admission: RE | Admit: 2017-03-30 | Discharge: 2017-03-30 | Disposition: A | Discharge status: Home / Self Care | Payer: Managed Care, Other (non HMO) | Source: Ambulatory Visit | Attending: Urology | Admitting: Urology

## 2017-03-30 DIAGNOSIS — N200 Calculus of kidney: Secondary | ICD-10-CM | POA: Diagnosis not present

## 2017-04-06 ENCOUNTER — Ambulatory Visit (INDEPENDENT_AMBULATORY_CARE_PROVIDER_SITE_OTHER): Payer: Managed Care, Other (non HMO) | Admitting: Urology

## 2017-04-06 ENCOUNTER — Telehealth: Payer: Self-pay | Admitting: Emergency Medicine

## 2017-04-06 ENCOUNTER — Encounter: Payer: Self-pay | Admitting: Urology

## 2017-04-06 VITALS — BP 156/90 | HR 71 | Ht 66.0 in | Wt 206.1 lb

## 2017-04-06 DIAGNOSIS — R3915 Urgency of urination: Secondary | ICD-10-CM

## 2017-04-06 DIAGNOSIS — N200 Calculus of kidney: Secondary | ICD-10-CM

## 2017-04-06 LAB — URINALYSIS, COMPLETE
Bilirubin, UA: NEGATIVE
KETONES UA: NEGATIVE
Leukocytes, UA: NEGATIVE
NITRITE UA: NEGATIVE
Protein, UA: NEGATIVE
Specific Gravity, UA: 1.025 (ref 1.005–1.030)
UUROB: 0.2 mg/dL (ref 0.2–1.0)
pH, UA: 5.5 (ref 5.0–7.5)

## 2017-04-06 LAB — MICROSCOPIC EXAMINATION
Bacteria, UA: NONE SEEN
RBC, UA: NONE SEEN /hpf (ref 0–?)

## 2017-04-06 NOTE — Progress Notes (Signed)
04/06/2017 10:09 AM   Kelly SplinterNorman R Egner 07/30/1961 [id number]  Referring provider: Glori LuisSonnenberg, Eric G, MD [address] [place], [place] [id number]  Chief Complaint  Patient presents with  . Hematuria    HPI: The patient is a 56 year old gentleman who underwent a right percutaneous nephrolithotomy on 02/24/2017 for a 1.6 cm stone.  He presents today for post-instrumentation renal ultrasounds and to go over his stone analysis. Post-PCNL CT scan showed no stones in the right ureter and small fragments less than 2 mm remaining in his right kidney. He had small stones in his left kidney as well.  Renal ultrasound at this time showed no hydronephrosis. It also showed decreased stone burden compared to post PCNL CT. All stones are small in size. This stone was 98% calcium oxalate monohydrate 2% calcium phosphate.   He has had a metabolic workup with a past disease had multiple stones. He believes he had some issue with his citric acid levels, but he is not exactly sure. He has had both ureteroscopy and ESWL in the past.   He also complains today of urinary urgency that he has had since the time of surgery. His very occasional incontinence. He feels that otherwise empties his bladder. He denies nocturia. He has a good stream and does not need to strain.   PMH: Past Medical History:  Diagnosis Date  . Arthritis    HANDS  . Chicken pox   . Diabetes (HCC)   . Difficult intubation   . Diverticulitis   . GERD (gastroesophageal reflux disease)   . History of kidney stones   . Hyperlipidemia     Surgical History: Past Surgical History:  Procedure Laterality Date  . COLONOSCOPY  2016  . CYSTOSCOPY WITH STENT PLACEMENT Right 02/24/2017   Procedure: CYSTOSCOPY WITH STENT PLACEMENT;  Surgeon: Hildred LaserBrian James Sahaj Bona, MD;  Location: [facility] ORS;  Service: Urology;  Laterality: Right;  . IR NEPHROSTOMY PLACEMENT RIGHT  02/23/2017  . KIDNEY STONE SURGERY  1994  . 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 Always use your most recent med list.          atorvastatin 40 MG tablet Commonly known as:  LIPITOR Take 1 tablet (40 mg total) by mouth daily.   meloxicam 7.5 MG tablet Commonly known as:  MOBIC Take 7.5 mg by mouth daily.   metFORMIN 500 MG tablet Commonly known as:  GLUCOPHAGE Take 1 tablet (500 mg total) by mouth 2 (two) times daily with a meal.   omeprazole 20 MG tablet Commonly known as:  PRILOSEC OTC Take 20 mg by mouth every 3 (three) days.       Allergies:  Allergies  Allergen Reactions  . 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 Hyperlipidemia Mother   . Hypertension Mother   . Hyperlipidemia Father   . Hypertension Father   . Arthritis Maternal Grandmother   . Cancer Maternal Grandmother        breast  . Arthritis Maternal Grandfather   .  Cancer Paternal Uncle        colon  . Prostate cancer Neg Hx   . Kidney cancer Neg Hx   . Bladder Cancer Neg Hx     Social History:  reports that he quit smoking about 28 years ago. His smoking use included Cigarettes. He has a 2.50 pack-year smoking history. He has never used smokeless tobacco. He reports that he drinks alcohol. He reports that he does not use drugs.  ROS:                                        Physical Exam: BP (!) 156/90 (BP Location: Left Arm, Patient Position: Sitting, Cuff Size: Normal)   Pulse 71   Ht 5\' 6"  (1.676 m)   Wt 206 lb 1.6 oz (93.5 kg)   BMI 33.27 kg/m   Constitutional:  Alert and oriented, No acute  distress. HEENT: [place] AT, moist mucus membranes.  Trachea midline, no masses. Cardiovascular: No clubbing, cyanosis, or edema. Respiratory: Normal respiratory effort, no increased work of breathing. GI: Abdomen is soft, nontender, nondistended, no abdominal masses GU: No CVA tenderness.  Skin: No rashes, bruises or suspicious lesions. Lymph: No cervical or inguinal adenopathy. Neurologic: Grossly intact, no focal deficits, moving all 4 extremities. Psychiatric: Normal mood and affect.  Laboratory Data: Lab Results  Component Value Date   WBC 10.5 02/25/2017   HGB 14.0 02/27/2017   HCT 39.4 (L) 02/25/2017   MCV 92.4 02/25/2017   PLT 177 02/25/2017    Lab Results  Component Value Date   CREATININE 1.05 02/27/2017    No results found for: PSA  No results found for: TESTOSTERONE  Lab Results  Component Value Date   HGBA1C 8.1 (H) 08/03/2016    Urinalysis    Component Value Date/Time   APPEARANCEUR Hazy (A) 03/10/2017 1410   GLUCOSEU Negative 03/10/2017 1410   BILIRUBINUR Negative 03/10/2017 1410   PROTEINUR 2+ (A) 03/10/2017 1410   UROBILINOGEN 0.2 01/13/2017 1255   NITRITE Negative 03/10/2017 1410   LEUKOCYTESUR 2+ (A) 03/10/2017 1410    Pertinent Imaging: Renal ultrasound reviewed as above  Assessment & Plan:    1. Nephrolithiasis The patient does have some very small residual stone burden. We will moderate sized a KUB in 1 year. We went over the ABC's his stone formation booklet always prevent future stones. I did offer repeating his 24-hour urine study since it does sound like he may have a correctable resolve. He is not interested at this time. He develops another stone or worsening of his current stones we will re-address undergoing this study.  2. Urinary urgency The patient has urinary urgency. At this time he does not want to try any medication. He'll call the office if it persists. We can try Myrbetriq 25 mg daily at that time.  Return in about 1 year  (around 04/06/2018) for with KUB prior.  Hildred Laser, MD  [facility] [address], [address] [place], [place] [address] [phone]

## 2017-04-06 NOTE — Telephone Encounter (Signed)
Patient came by the office and expressed that he just left his Urologist office and was informed that he needed to start back on his Lisinopril.  He needs a 90 day refill supply [facility]. He said that you started him on a low dose last year and stopped taking it since his blood pressure have been running fine.  His Urologist said his blood pressure today is elevated.

## 2017-04-06 NOTE — Telephone Encounter (Signed)
Need to see patient

## 2017-04-07 ENCOUNTER — Ambulatory Visit: Payer: Self-pay | Admitting: Physician Assistant

## 2017-04-07 ENCOUNTER — Encounter: Payer: Self-pay | Admitting: Physician Assistant

## 2017-04-07 VITALS — BP 140/89 | HR 74 | Temp 98.5°F

## 2017-04-07 DIAGNOSIS — R0602 Shortness of breath: Secondary | ICD-10-CM

## 2017-04-07 DIAGNOSIS — I10 Essential (primary) hypertension: Secondary | ICD-10-CM

## 2017-04-07 MED ORDER — LISINOPRIL 20 MG PO TABS: 20.0000 mg | ORAL_TABLET | Freq: Every day | ORAL | 3 refills | Status: DC

## 2017-04-07 NOTE — Progress Notes (Signed)
S: pt had a procedure done to remove his kidney stone and his bp was elevated, since then bp has still been elevated, states he use to be on lisinopril but had lost a lot of weight so his bp was normal.  Says he took phentermine for about a year nonstop to lose weight and did really well.  Now he is worried bc he will intermittently have some sob when walking, no pattern to the sob, no cp, hasn't seen a cardiologist in over 10years, denies swelling in feet and ankles, denies cp when he is feeling sob, no wheezing  O: vitals w slightly elevated bp, lungs c t a, cv rrr, no pedal edema  A: htn, sob on exertion  P: lisinopril 20 mg qd, will refer to cardiology as pt has metabolic syndrome combined with the sob

## 2017-06-26 ENCOUNTER — Other Ambulatory Visit: Payer: Self-pay | Admitting: Pain Medicine

## 2017-06-26 DIAGNOSIS — M79645 Pain in left finger(s): Principal | ICD-10-CM

## 2017-06-26 DIAGNOSIS — G8929 Other chronic pain: Secondary | ICD-10-CM

## 2017-06-27 ENCOUNTER — Encounter: Payer: Self-pay | Admitting: Pain Medicine

## 2017-06-27 ENCOUNTER — Ambulatory Visit (HOSPITAL_BASED_OUTPATIENT_CLINIC_OR_DEPARTMENT_OTHER): Payer: Managed Care, Other (non HMO) | Admitting: Pain Medicine

## 2017-06-27 ENCOUNTER — Ambulatory Visit
Admission: RE | Admit: 2017-06-27 | Discharge: 2017-06-27 | Disposition: A | Discharge status: Home / Self Care | Payer: Managed Care, Other (non HMO) | Source: Ambulatory Visit | Attending: Pain Medicine | Admitting: Pain Medicine

## 2017-06-27 VITALS — BP 119/79 | HR 87 | Temp 98.4°F | Resp 16 | Ht 66.0 in | Wt 195.0 lb

## 2017-06-27 DIAGNOSIS — Z79899 Other long term (current) drug therapy: Secondary | ICD-10-CM | POA: Diagnosis not present

## 2017-06-27 DIAGNOSIS — M19042 Primary osteoarthritis, left hand: Secondary | ICD-10-CM | POA: Insufficient documentation

## 2017-06-27 DIAGNOSIS — M79645 Pain in left finger(s): Secondary | ICD-10-CM

## 2017-06-27 DIAGNOSIS — M19049 Primary osteoarthritis, unspecified hand: Secondary | ICD-10-CM

## 2017-06-27 DIAGNOSIS — Z7982 Long term (current) use of aspirin: Secondary | ICD-10-CM | POA: Insufficient documentation

## 2017-06-27 DIAGNOSIS — G8929 Other chronic pain: Secondary | ICD-10-CM

## 2017-06-27 DIAGNOSIS — M1812 Unilateral primary osteoarthritis of first carpometacarpal joint, left hand: Secondary | ICD-10-CM | POA: Insufficient documentation

## 2017-06-27 HISTORY — DX: Unilateral primary osteoarthritis of first carpometacarpal joint, left hand: M18.12

## 2017-06-27 MED ORDER — LIDOCAINE HCL (PF) 1 % IJ SOLN
3.0000 mL | Freq: Once | INTRAMUSCULAR | Status: AC
  Administered 2017-06-27: 5 mL

## 2017-06-27 MED ORDER — METHYLPREDNISOLONE ACETATE 80 MG/ML IJ SUSP
80.0000 mg | Freq: Once | INTRAMUSCULAR | Status: AC
  Administered 2017-06-27: 80 mg via INTRA_ARTICULAR
  Filled 2017-06-27: qty 1

## 2017-06-27 MED ORDER — ROPIVACAINE HCL 2 MG/ML IJ SOLN
1.0000 mL | Freq: Once | INTRAMUSCULAR | Status: AC
  Administered 2017-06-27: 10 mL via INTRA_ARTICULAR
  Filled 2017-06-27: qty 10

## 2017-06-27 MED ORDER — MELOXICAM 15 MG PO TABS: 15.0000 mg | ORAL_TABLET | Freq: Every day | ORAL | 1 refills | Status: DC

## 2017-06-27 NOTE — Progress Notes (Signed)
Safety precautions to be maintained throughout the outpatient stay will include: orient to surroundings, keep bed in low position, maintain call bell within reach at all times, provide assistance with transfer out of bed and ambulation.  

## 2017-06-27 NOTE — Patient Instructions (Addendum)
____________________________________________________________________________________________  Post-Procedure instructions Instructions:  Apply ice: Fill a plastic sandwich bag with crushed ice. Cover it with a small towel and apply to injection site. Apply for 15 minutes then remove x 15 minutes. Repeat sequence on day of procedure, until you go to bed. The purpose is to minimize swelling and discomfort after procedure.  Apply heat: Apply heat to procedure site starting the day following the procedure. The purpose is to treat any soreness and discomfort from the procedure.  Food intake: Start with clear liquids (like water) and advance to regular food, as tolerated.   Physical activities: Keep activities to a minimum for the first 8 hours after the procedure.   Driving: If you have received any sedation, you are not allowed to drive for 24 hours after your procedure.  Blood thinner: Restart your blood thinner 6 hours after your procedure. (Only for those taking blood thinners)  Insulin: As soon as you can eat, you may resume your normal dosing schedule. (Only for those taking insulin)  Infection prevention: Keep procedure site clean and dry.  Post-procedure Pain Diary: Extremely important that this be done correctly and accurately. Recorded information will be used to determine the next step in treatment.  Pain evaluated is that of treated area only. Do not include pain from an untreated area.  Complete every hour, on the hour, for the initial 8 hours. Set an alarm to help you do this part accurately.  Do not go to sleep and have it completed later. It will not be accurate.  Follow-up appointment: Keep your follow-up appointment after the procedure. Usually 2 weeks for most procedures. (6 weeks in the case of radiofrequency.) Bring you pain diary.  Expect:  From numbing medicine (AKA: Local Anesthetics): Numbness or decrease in pain.  Onset: Full effect within 15 minutes of  injected.  Duration: It will depend on the type of local anesthetic used. On the average, 1 to 8 hours.   From steroids: Decrease in swelling or inflammation. Once inflammation is improved, relief of the pain will follow.  Onset of benefits: Depends on the amount of swelling present. The more swelling, the longer it will take for the benefits to be seen. In some cases, up to 10 days.  Duration: Steroids will stay in the system x 2 weeks. Duration of benefits will depend on multiple posibilities including persistent irritating factors.  From procedure: Some discomfort is to be expected once the numbing medicine wears off. This should be minimal if ice and heat are applied as instructed. Call if:  You experience numbness and weakness that gets worse with time, as opposed to wearing off.  New onset bowel or bladder incontinence. (Spinal procedures only)  Emergency Numbers:  Durning business hours (Monday - Thursday, 8:00 AM - 4:00 PM) (Friday, 9:00 AM - 12:00 Noon): [phone]  After hours: [phone] Please complete your Post Procedure Diary and bring it to your next appointment. ____________________________________________________________________________________________

## 2017-06-27 NOTE — Progress Notes (Signed)
Patient's Name: Caleb Warren  MRN: [id number]  Referring Provider: Glori Luis, MD  DOB: October 14, 1961  PCP: Glori Luis, MD  DOS: 06/27/2017  Note by: Oswaldo Done, MD  Service setting: Ambulatory outpatient  Specialty: Interventional Pain Management  Patient type: Established  Location: [facility] (AMB) Pain Management Facility  Visit type: Interventional Procedure   Primary Reason for Visit: Interventional Pain Management Treatment. CC: Hand Pain (left thumb)  Procedure:  Anesthesia, Analgesia, Anxiolysis:  Type: Diagnostic CMC (Carpometacarpal joint) Steroid Injection Region: Dorsal Interspace between the carpometacarpal joint and the trapezius bone. Level: Wrist Laterality: Left-Sided  Type: Local Anesthesia Local Anesthetic: Lidocaine 1% Route: Infiltration ([place]/IM) IV Access: Declined Sedation: Declined  Indication(s): Analgesia          Indications: 1. Arthritis of carpometacarpal (CMC) joint of left thumb   2. Localized primary osteoarthritis of carpometacarpal joint of left thumb   3. Arthritis of carpometacarpal joint   4. Chronic thumb pain (Left)    Pain Score: Pre-procedure: 3 /10 Post-procedure: 1 /10  Pre-op Assessment:  Mr. Carmer is a 56 y.o. (year old), male patient, seen today for interventional treatment. He  has a past surgical history that includes Kidney stone surgery (1994); Colonoscopy (2016); IR NEPHROSTOMY PLACEMENT RIGHT (02/23/2017); Nephrolithotomy (Right, 02/24/2017); and Cystoscopy with stent placement (Right, 02/24/2017). Mr. Liddy has a current medication list which includes the following prescription(s): aspirin, atorvastatin, lisinopril, metformin, omeprazole, and meloxicam. His primarily concern today is the Hand Pain (left thumb)  Initial Vital Signs: Blood pressure 119/79, pulse 87, temperature 98.4 F (36.9 C), temperature source Oral, resp. rate 16, height 5\' 6"  (1.676 m), weight 195 lb (88.5 kg), SpO2 95 %. BMI: Estimated body mass index is  31.47 kg/m as calculated from the following:   Height as of this encounter: 5\' 6"  (1.676 m).   Weight as of this encounter: 195 lb (88.5 kg).  Risk Assessment: Allergies: Reviewed. 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  Allergy Precautions: None required Coagulopathies: Reviewed. None identified.  Blood-thinner therapy: None at this time Active Infection(s): Reviewed. None identified. Mr. Mendolia is afebrile  Site Confirmation: Mr. Ellena was asked to confirm the procedure and laterality before marking the site Procedure checklist: Completed Consent: Before the procedure and under the influence of no sedative(s), amnesic(s), or anxiolytics, the patient was informed of the treatment options, risks and possible complications. To fulfill our ethical and legal obligations, as recommended by the American Medical Association's Code of Ethics, I have informed the patient of my clinical impression; the nature and purpose of the treatment or procedure; the risks, benefits, and possible complications of the intervention; the alternatives, including doing nothing; the risk(s) and benefit(s) of the alternative treatment(s) or procedure(s); and the risk(s) and benefit(s) of doing nothing. The patient was provided information about the general risks and possible complications associated with the procedure. These may include, but are not limited to: failure to achieve desired goals, infection, bleeding, organ or nerve damage, allergic reactions, paralysis, and death. In addition, the patient was informed of those risks and complications associated to the procedure, such as failure to decrease pain; infection; bleeding; organ or nerve damage with subsequent damage to sensory, motor, and/or autonomic systems, resulting in permanent pain, numbness, and/or weakness of one or several areas of the body; allergic reactions; (i.e.: anaphylactic reaction); and/or death. Furthermore, the patient was informed of  those risks and complications associated with the medications. These include, but are not limited to: allergic reactions (i.e.: anaphylactic  or anaphylactoid reaction(s)); adrenal axis suppression; blood sugar elevation that in diabetics may result in ketoacidosis or comma; water retention that in patients with history of congestive heart failure may result in shortness of breath, pulmonary edema, and decompensation with resultant heart failure; weight gain; swelling or edema; medication-induced neural toxicity; particulate matter embolism and blood vessel occlusion with resultant organ, and/or nervous system infarction; and/or aseptic necrosis of one or more joints. Finally, the patient was informed that Medicine is not an exact science; therefore, there is also the possibility of unforeseen or unpredictable risks and/or possible complications that may result in a catastrophic outcome. The patient indicated having understood very clearly. We have given the patient no guarantees and we have made no promises. Enough time was given to the patient to ask questions, all of which were answered to the patient's satisfaction. Mr. Simkin has indicated that he wanted to continue with the procedure. Attestation: I, the ordering provider, attest that I have discussed with the patient the benefits, risks, side-effects, alternatives, likelihood of achieving goals, and potential problems during recovery for the procedure that I have provided informed consent. Date: 06/27/2017; Time: 3:04 PM  Pre-Procedure Preparation:  Monitoring: As per clinic protocol. Respiration, ETCO2, SpO2, BP, heart rate and rhythm monitor placed and checked for adequate function Safety Precautions: Patient was assessed for positional comfort and pressure points before starting the procedure. Time-out: I initiated and conducted the "Time-out" before starting the procedure, as per protocol. The patient was asked to participate by confirming the accuracy  of the "Time Out" information. Verification of the correct person, site, and procedure were performed and confirmed by me, the nursing staff, and the patient. "Time-out" conducted as per Joint Commission's Universal Protocol (UP.01.01.01). "Time-out" Date & Time: 06/27/2017; 1421 hrs.  Description of Procedure Process:   Position: Sitting Target Area: Space between carpometacarpal joint and trapezium. Approach: Dorsal approach. Area Prepped: Entire wrist Region Prepping solution: ChloraPrep (2% chlorhexidine gluconate and 70% isopropyl alcohol) Safety Precautions: Aspiration looking for blood return was conducted prior to all injections. At no point did we inject any substances, as a needle was being advanced. No attempts were made at seeking any paresthesias. Safe injection practices and needle disposal techniques used. Medications properly checked for expiration dates. SDV (single dose vial) medications used. Description of the Procedure: Protocol guidelines were followed. The patient was placed in position. The target area was identified and the area prepped in the usual manner. Skin & deeper tissues infiltrated with local anesthetic. Appropriate amount of time allowed to pass for local anesthetics to take effect. The procedure needles were then advanced to the target area. Proper needle placement secured. Negative aspiration confirmed. Solution injected in intermittent fashion, asking for systemic symptoms every 0.5cc of injectate. The needles were then removed and the area cleansed, making sure to leave some of the prepping solution back to take advantage of its long term bactericidal properties.  Vitals:   06/27/17 1314 06/27/17 1424 06/27/17 1427  BP: 129/80 120/77 119/79  Pulse: 87  87  Resp: 16    Temp: 98.4 F (36.9 C)    TempSrc: Oral    SpO2: 95%    Weight: 195 lb (88.5 kg)    Height: 5\' 6"  (1.676 m)      Start Time: 1422 hrs. End Time: 1425 hrs. Materials:  Needle(s) Type:  Regular needle Gauge: 22G Length: 3.5-in Medication(s): We administered methylPREDNISolone acetate, ropivacaine (PF) 2 mg/mL (0.2%), and lidocaine (PF). Please see chart orders for dosing details.  Imaging Guidance:  Type of Imaging Technique: None used Indication(s): N/A Exposure Time: No patient exposure Contrast: None used. Fluoroscopic Guidance: N/A Ultrasound Guidance: N/A Interpretation: N/A  Antibiotic Prophylaxis:  Indication(s): None identified Antibiotic given: None  Post-operative Assessment:  EBL: None Complications: No immediate post-treatment complications observed by team, or reported by patient. Note: The patient tolerated the entire procedure well. A repeat set of vitals were taken after the procedure and the patient was kept under observation following institutional policy, for this type of procedure. Post-procedural neurological assessment was performed, showing return to baseline, prior to discharge. The patient was provided with post-procedure discharge instructions, including a section on how to identify potential problems. Should any problems arise concerning this procedure, the patient was given instructions to immediately contact us, at any time, without hesitation. In any case, we plan to contact the patient by telephone for a follow-up status report regarding this interventional procedure. Comments:  No additional relevant information.  Plan of Care  Disposition: Discharge home  Discharge Date & Time: 06/27/2017; 1430 hrs.  Physician-requested Follow-up:  Return for post-procedure eval by Dr. Laban EmperorNaveira in 2 weeks.  New Prescriptions   MELOXICAM (MOBIC) 15 MG TABLET    Take 1 tablet (15 mg total) by mouth daily.   Future Appointments Date Time Provider Department Center  07/19/2017 2:15 PM Delano MetzNaveira, Roney Youtz, MD [facility] None  04/05/2018 8:45 AM [facility] None    Imaging Orders     DG C-Arm 1-60 Min-No Report  Procedure  Orders     Small Joint Injection/Arthrocentesis Medications ordered for procedure: Meds ordered this encounter  Medications  . methylPREDNISolone acetate (DEPO-MEDROL) injection 80 mg  . ropivacaine (PF) 2 mg/mL (0.2%) (NAROPIN) injection 1 mL  . lidocaine (PF) (XYLOCAINE) 1 % injection 3 mL  . meloxicam (MOBIC) 15 MG tablet    Sig: Take 1 tablet (15 mg total) by mouth daily.    Dispense:  90 tablet    Refill:  1    Do not add this medication to the electronic "Automatic Refill" notification system. Patient may have prescription filled one day early if pharmacy is closed on scheduled refill date.   Medications administered: We administered methylPREDNISolone acetate, ropivacaine (PF) 2 mg/mL (0.2%), and lidocaine (PF).  See the medical record for exact dosing, route, and time of administration.  Primary Care Physician: Glori LuisSonnenberg, Eric G, MD [facility] Note by: Oswaldo DoneFrancisco A Shanea Karney, MD Date: 06/27/2017; Time: 3:18 PM  Disclaimer:  Medicine is not an Visual merchandiserexact science. The only guarantee in medicine is that nothing is guaranteed. It is important to note that the decision to proceed with this intervention was based on the information collected from the patient. The Data and conclusions were drawn from the patient's questionnaire, the interview, and the physical examination. Because the information was provided in large part by the patient, it cannot be guaranteed that it has not been purposely or unconsciously manipulated. Every effort has been made to obtain as much relevant data as possible for this evaluation. It is important to note that the conclusions that lead to this procedure are derived in large part from the available data. Always take into account that the treatment will also be dependent on availability of resources and existing treatment guidelines, considered by other Pain Management Practitioners as being common knowledge and practice, at the  time of the intervention. For Medico-Legal purposes, it is also important to point out that variation in procedural techniques and pharmacological choices  are the acceptable norm. The indications, contraindications, technique, and results of the above procedure should only be interpreted and judged by a Board-Certified Interventional Pain Specialist with extensive familiarity and expertise in the same exact procedure and technique.

## 2017-06-28 ENCOUNTER — Telehealth: Payer: Self-pay

## 2017-06-28 NOTE — Telephone Encounter (Signed)
Denies any needs at this time. Instructed to call if needed. 

## 2017-07-18 NOTE — Progress Notes (Signed)
Patient's Name: Caleb Warren  MRN: [id number]  Referring Provider: Leone Haven, MD  DOB: 06-24-1961  PCP: Leone Haven, MD  DOS: 07/19/2017  Note by: Gaspar Cola, MD  Service setting: Ambulatory outpatient  Specialty: Interventional Pain Management  Location: [facility] (AMB) Pain Management Facility    Patient type: Established   Primary Reason(s) for Visit: Encounter for post-procedure evaluation of chronic illness with mild to moderate exacerbation CC: Hand Pain (left)  HPI  Caleb Warren is a 56 y.o. year old, male patient, who comes today for a post-procedure evaluation. He has Diabetes mellitus type 2, controlled ([place]); Hypertension; Hyperlipidemia; Low testosterone; Shift work sleep disorder; Obesity (BMI 30-39.9); Lipid screening; Routine general medical examination at a health care facility; Left hand pain; Sesamoiditis (Left); Arthritis associated with diabetes ([place]); Bilateral hand pain; Nephrolithiasis; Chronic thumb pain (Left); Localized primary osteoarthritis of carpometacarpal joint of left thumb; Arthritis of carpometacarpal (CMC) joint of left thumb; and Arthritis of carpometacarpal joint on his problem list. His primarily concern today is the Hand Pain (left)  Pain Assessment: Location: Left Hand Radiating: stays in the hand Onset: More than a month ago Duration: Chronic pain Quality: Aching, Constant, Penetrating Severity: 1 /10 (self-reported pain score)  Note: Reported level is compatible with observation.                   Effect on ADL: pace self Timing: Constant (constant when puts pressure on it, or move it) Modifying factors: injections  Caleb Warren comes in today for post-procedure evaluation after the treatment done on 06/27/2017.  Further details on both, my assessment(s), as well as the proposed treatment plan, please see below.  Post-Procedure Assessment  06/27/2017 Procedure: Diagnostic/therapeutic right thumb CMC (Carpometacarpal joint) Steroid  Injection  Pre-procedure pain score:  3/10 Post-procedure pain score: 1/10 Incomplete relief Influential Factors: BMI: 30.67 kg/m Intra-procedural challenges: None observed.         Assessment challenges: None detected.              Reported side-effects: None.        Post-procedural adverse reactions or complications: None reported         Sedation: No sedation used. When no sedatives are used, the analgesic levels obtained are directly associated to the effectiveness of the local anesthetics. However, when sedation is provided, the level of analgesia obtained during the initial 1 hour following the intervention, is believed to be the result of a combination of factors. These factors may include, but are not limited to: 1. The effectiveness of the local anesthetics used. 2. The effects of the analgesic(s) and/or anxiolytic(s) used. 3. The degree of discomfort experienced by the patient at the time of the procedure. 4. The patients ability and reliability in recalling and recording the events. 5. The presence and influence of possible secondary gains and/or psychosocial factors. Reported result: Relief experienced during the 1st hour after the procedure: 75 % (Ultra-Short Term Relief) Caleb Warren has indicated area to have been numb during this time. Interpretative annotation: Clinically appropriate result. No IV Analgesic or Anxiolytic given, therefore benefits are completely due to Local Anesthetic effects.          Effects of local anesthetic: The analgesic effects attained during this period are directly associated to the localized infiltration of local anesthetics and therefore cary significant diagnostic value as to the etiological location, or anatomical origin, of the pain. Expected duration of relief is directly dependent on the pharmacodynamics of the local  anesthetic used. Long-acting (4-6 hours) anesthetics used.  Reported result: Relief during the next 4 to 6 hour after the procedure:  0 % (Short-Term Relief)            Interpretative annotation: Clinically possible results. Partial relief would suggest incomplete involvement of injected area.          Long-term benefit: Defined as the period of time past the expected duration of local anesthetics (1 hour for short-acting and 4-6 hours for long-acting). With the possible exception of prolonged sympathetic blockade from the local anesthetics, benefits during this period are typically attributed to, or associated with, other factors such as analgesic sensory neuropraxia, antiinflammatory effects, or beneficial biochemical changes provided by agents other than the local anesthetics.  Reported result: Extended relief following procedure: 90 % (Long-Term Relief)            Interpretative annotation: Clinically appropriate result. Good relief. No permanent benefit expected. Inflammation plays a part in the etiology to the pain. Benefit believed to be steroid-related.  Current benefits: Defined as persistent relief that continues at this point in time.   Reported results: Treated area: 90 % Caleb Warren reports improvement in function Interpretative annotation: Recurrence of symptoms. No permanent benefit expected. Effective diagnostic intervention.          Interpretation: Results would suggest this therapy to be effective in the management of Caleb Warren condition.                  Plan:  Set up procedure as a PRN palliative treatment option for this patient.  Laboratory Chemistry  Inflammation Markers (CRP: Acute Phase) (ESR: Chronic Phase) Lab Results  Component Value Date   CRP 1.0 (H) 12/16/2016   ESRSEDRATE 9 12/16/2016                 Renal Function Markers Lab Results  Component Value Date   BUN 11 02/27/2017   CREATININE 1.05 02/27/2017   GFRAA >60 02/27/2017   GFRNONAA >60 02/27/2017                 Hepatic Function Markers Lab Results  Component Value Date   AST 24 08/03/2016   ALT 35 08/03/2016   ALBUMIN 4.4  08/03/2016   ALKPHOS 63 08/03/2016                 Electrolytes Lab Results  Component Value Date   NA 139 02/25/2017   K 4.6 02/25/2017   CL 106 02/25/2017   CALCIUM 8.1 (L) 02/25/2017                 Neuropathy Markers No results found for: [id number]               Bone Pathology Markers Lab Results  Component Value Date   ALKPHOS 63 08/03/2016   CALCIUM 8.1 (L) 02/25/2017                 Coagulation Parameters Lab Results  Component Value Date   INR 0.94 02/23/2017   LABPROT 12.6 02/23/2017   APTT 34 02/23/2017   PLT 177 02/25/2017                 Cardiovascular Markers Lab Results  Component Value Date   HGB 14.0 02/27/2017   HCT 39.4 (L) 02/25/2017                 Note: Lab results reviewed.  Recent Diagnostic Imaging Review  Dg C-arm  1-60 Min-no Report  Result Date: 06/27/2017 Fluoroscopy was utilized by the requesting physician.  No radiographic interpretation.   Note: Imaging results reviewed.          Meds   Current Outpatient Prescriptions:  .  aspirin 81 MG tablet, Take 81 mg by mouth daily., Disp: , Rfl:  .  atorvastatin (LIPITOR) 40 MG tablet, Take 1 tablet (40 mg total) by mouth daily. (Patient taking differently: Take 40 mg by mouth every evening. ), Disp: 90 tablet, Rfl: 3 .  lisinopril (PRINIVIL,ZESTRIL) 20 MG tablet, Take 1 tablet (20 mg total) by mouth daily., Disp: 90 tablet, Rfl: 3 .  meloxicam (MOBIC) 15 MG tablet, Take 1 tablet (15 mg total) by mouth daily., Disp: 90 tablet, Rfl: 1 .  metFORMIN (GLUCOPHAGE) 500 MG tablet, Take 1 tablet (500 mg total) by mouth 2 (two) times daily with a meal., Disp: 180 tablet, Rfl: 3 .  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  [place]  Drug: Caleb Warren  reports that he does not use drugs. Alcohol:  reports that he drinks alcohol. Tobacco:  reports that he quit smoking about 28 years ago. His smoking use included Cigarettes. He has a 2.50 pack-year smoking history. He has never used smokeless tobacco. Medical:  has a past medical history of Arthritis; Chicken pox; Diabetes ([place]); Difficult intubation; Diverticulitis; GERD (gastroesophageal reflux disease); History of kidney stones; and Hyperlipidemia. Surgical: Caleb Warren  has a past surgical history that includes Kidney stone surgery (1994); Colonoscopy (2016); IR NEPHROSTOMY PLACEMENT RIGHT (02/23/2017); Nephrolithotomy (Right, 02/24/2017); and Cystoscopy with stent placement (Right, 02/24/2017). Family: family history includes Arthritis in his maternal grandfather and maternal grandmother; Cancer in his maternal grandmother and paternal uncle; Hyperlipidemia in his father and mother; Hypertension in his father and mother.  Constitutional Exam  General appearance: Well nourished, well developed, and well hydrated. In no apparent acute distress Vitals:   07/19/17 1401  BP: (!) 143/88  Pulse: 74  Resp: 16  Temp: (!) 97.1 F (36.2 C)  SpO2: 95%  Weight: 190 lb (86.2 kg)  Height: '5\' 6"'$  (1.676 m)   BMI Assessment: Estimated body mass index is 30.67 kg/m as calculated from the following:   Height as of this encounter: '5\' 6"'$  (1.676 m).   Weight as of this encounter: 190 lb (86.2 kg).  BMI interpretation table: BMI level Category Range association with higher incidence of chronic pain  <18 kg/m2 Underweight   18.5-24.9 kg/m2 Ideal body weight   25-29.9 kg/m2 Overweight Increased incidence by 20%  30-34.9 kg/m2 Obese (Class I) Increased  incidence by 68%  35-39.9 kg/m2 Severe obesity (Class II) Increased incidence by 136%  >40 kg/m2 Extreme obesity (Class III) Increased incidence by 254%   BMI Readings from Last 4 Encounters:  07/19/17 30.67 kg/m  06/27/17 31.47 kg/m  04/06/17 33.27 kg/m  03/10/17 31.31 kg/m   Wt Readings from Last 4 Encounters:  07/19/17 190 lb (86.2 kg)  06/27/17 195 lb (88.5 kg)  04/06/17 206 lb 1.6 oz (93.5 kg)  03/10/17 194 lb (88  kg)  Psych/Mental status: Alert, oriented x 3 (person, place, & time)       Eyes: PERLA Respiratory: No evidence of acute respiratory distress  Upper Extremity (UE) Exam    Side: Right upper extremity  Side: Left upper extremity  Skin & Extremity Inspection: Skin color, temperature, and hair growth are WNL. No peripheral edema or cyanosis. No masses, redness, swelling, asymmetry, or associated skin lesions. No contractures.  Skin & Extremity Inspection: Skin color, temperature, and hair growth are WNL. No peripheral edema or cyanosis. No masses, redness, swelling, asymmetry, or associated skin lesions. No contractures.  Functional ROM: Improved after treatment for hand  Functional ROM: Unrestricted ROM          Muscle Tone/Strength: Functionally intact. No obvious neuro-muscular anomalies detected.  Muscle Tone/Strength: Functionally intact. No obvious neuro-muscular anomalies detected.  Sensory (Neurological): Articular pain pattern          Sensory (Neurological): Unimpaired          Palpation: No palpable anomalies              Palpation: No palpable anomalies              Specialized Test(s): Deferred         Specialized Test(s): Deferred          Assessment  Primary Diagnosis & Pertinent Problem List: The primary encounter diagnosis was Arthritis of carpometacarpal (CMC) joint of left thumb. Diagnoses of Arthritis of carpometacarpal joint and Chronic thumb pain (Left) were also pertinent to this visit.  Status Diagnosis  Improved Improved Improved 1.  Arthritis of carpometacarpal (CMC) joint of left thumb   2. Arthritis of carpometacarpal joint   3. Chronic thumb pain (Left)     Problems updated and reviewed during this visit: No problems updated. Plan of Care  Pharmacotherapy (Medications Ordered): No orders of the defined types were placed in this encounter.  New Prescriptions   No medications on file   Medications administered today: Caleb Warren had no medications administered during this visit.   Procedure Orders     Small Joint Injection/Arthrocentesis Lab Orders  No laboratory test(s) ordered today   Imaging Orders  No imaging studies ordered today   Referral Orders  No referral(s) requested today    Interventional management options: Planned, scheduled, and/or pending:   Not at this time.   Considering:   Therapeutic right thumb CMC (Carpometacarpal joint) Steroid Injection #2   Palliative PRN treatment(s):   Palliative  right thumb CMC (Carpometacarpal joint) Steroid Injection #2   Provider-requested follow-up: Return if symptoms worsen or fail to improve.  Future Appointments Date Time Provider [place]  04/05/2018 8:45 AM [facility] None   Primary Care Physician: Leone Haven, MD Location: [facility] Note by: Gaspar Cola, MD Date: 07/19/2017; Time: 4:52 PM

## 2017-07-19 ENCOUNTER — Encounter: Payer: Self-pay | Admitting: Pain Medicine

## 2017-07-19 ENCOUNTER — Ambulatory Visit: Payer: Managed Care, Other (non HMO) | Attending: Pain Medicine | Admitting: Pain Medicine

## 2017-07-19 VITALS — BP 143/88 | HR 74 | Temp 97.1°F | Resp 16 | Ht 66.0 in | Wt 190.0 lb

## 2017-07-19 DIAGNOSIS — M1812 Unilateral primary osteoarthritis of first carpometacarpal joint, left hand: Secondary | ICD-10-CM | POA: Diagnosis not present

## 2017-07-19 DIAGNOSIS — M19049 Primary osteoarthritis, unspecified hand: Secondary | ICD-10-CM | POA: Diagnosis not present

## 2017-07-19 DIAGNOSIS — G8929 Other chronic pain: Secondary | ICD-10-CM | POA: Diagnosis not present

## 2017-07-19 DIAGNOSIS — M79645 Pain in left finger(s): Secondary | ICD-10-CM

## 2017-07-19 NOTE — Progress Notes (Signed)
Safety precautions to be maintained throughout the outpatient stay will include: orient to surroundings, keep bed in low position, maintain call bell within reach at all times, provide assistance with transfer out of bed and ambulation.  

## 2017-07-27 LAB — HM DIABETES EYE EXAM

## 2017-07-28 ENCOUNTER — Encounter: Payer: Self-pay | Admitting: Family Medicine

## 2017-08-18 ENCOUNTER — Other Ambulatory Visit: Payer: Self-pay | Admitting: Family Medicine

## 2017-10-11 ENCOUNTER — Ambulatory Visit: Payer: Self-pay | Admitting: Physician Assistant

## 2017-10-11 ENCOUNTER — Encounter: Payer: Self-pay | Admitting: Physician Assistant

## 2017-10-11 VITALS — BP 130/79

## 2017-10-11 DIAGNOSIS — I10 Essential (primary) hypertension: Secondary | ICD-10-CM

## 2017-10-11 DIAGNOSIS — R0602 Shortness of breath: Secondary | ICD-10-CM

## 2017-10-11 MED ORDER — AMLODIPINE BESYLATE 5 MG PO TABS: 5.0000 mg | ORAL_TABLET | Freq: Every day | ORAL | 3 refills | Status: DC

## 2017-10-11 NOTE — Progress Notes (Signed)
S: Complains of elevated blood pressure, chronic cough, some chest "heaviness" on exertion, sometimes he will have pain but at other times he will not, he has a history of diabetes and high triglycerides, he is currently taking lisinopril  O: Vitals are normal except for blood pressure, lungs are clear to auscultation, heart sounds are normal  A: Hypertension, shortness of breath on exertion, diabetes type 2  P: No change in blood pressure medication to amlodipine 5 mg daily, patient is to check his blood pressure at home, his wife is a nurse and can check the pressure, he has an appointment with cardiology for December 11 to assess the shortness of breath on exertion

## 2017-10-19 NOTE — Progress Notes (Signed)
Cardiology Office Note  Date:  10/20/2017   ID:  Redmond Basemanorman R Schnieders, DOB 12/26/1960, MRN [id number]  PCP:  Glori LuisSonnenberg, Eric G, MD   Chief Complaint  Patient presents with  . other    Ref by employee health  at [facility]Susan Fisher for shortness of breath on exertion. Meds reviewed by the pt. verbally. Pt. c/o shortness of breath with chest pressure for a couple of months.     HPI:  Mr. Rosamaria Lintsice is a 56 year old gentleman with past medical history of gerd DM, poorly controlled, hemoglobin A1c of 10 HTN Arthritis in his hands Kidney stone, requiring nephrostomy tubes and extraction Smoker, quit 28 yr ago, stopped 1990, smoked 5 years Obesity Presenting by referral from employee health/Susan Fisher for consultation of his  chest pressure and SOB.  He reports noticing increasing shortness of breath and some chest pressure over the past 6 months Wife has noticed his symptoms Worse on exertion, especially walking up hills Some days able to walk and do everything he wants, other days will have shortness of breath Periodically with a pressure sensation in his central mediastinal area with no radiation  Emergency planning/management officerolice officer, desk job, no regular exercise program [place], active recently   Previously took weight loss medication phentermine Weight down to 170 pounds, now back up to 200 pounds  CT ABD 02/2017 Images reviewed with him in detail showing mild abdominal aortic atherosclerosis extending into common iliac arteries No coronary calcification noted  EKG personally reviewed by myself on todays visit Shows normal sinus rhythm rate 80 bpm no significant ST or T wave changes  Mom dies 1529 MVA dather died, etiology unclear  PMH:   has a past medical history of Arthritis, Chicken pox, Diabetes (HCC), Difficult intubation, Diverticulitis, GERD (gastroesophageal reflux disease), History of kidney stones, and Hyperlipidemia.  PSH:    Past Surgical History:   Procedure Laterality Date  . COLONOSCOPY  2016  . CYSTOSCOPY WITH STENT PLACEMENT Right 02/24/2017   Procedure: CYSTOSCOPY WITH STENT PLACEMENT;  Surgeon: Hildred LaserBrian James Budzyn, MD;  Location: [facility] ORS;  Service: Urology;  Laterality: Right;  . IR NEPHROSTOMY PLACEMENT RIGHT  02/23/2017  . KIDNEY STONE SURGERY  1994  . NEPHROLITHOTOMY Right 02/24/2017   Procedure: NEPHROLITHOTOMY PERCUTANEOUS;  Surgeon: Hildred LaserBrian James Budzyn, MD;  Location: [facility] ORS;  Service: Urology;  Laterality: Right;    Current Outpatient Medications  Medication Sig Dispense Refill  . amLODipine (NORVASC) 5 MG tablet Take 1 tablet (5 mg total) by mouth daily. 90 tablet 3  . aspirin 81 MG tablet Take 81 mg by mouth daily.    Marland Kitchen. atorvastatin (LIPITOR) 40 MG tablet TAKE 1 TABLET BY MOUTH DAILY 90 tablet 1  . meloxicam (MOBIC) 15 MG tablet Take 1 tablet (15 mg total) by mouth daily. 90 tablet 1  . metFORMIN (GLUCOPHAGE) 500 MG tablet TAKE 1 TABLET BY MOUTH TWO TIMES DAILY WITH A MEAL 180 tablet 1  . omeprazole (PRILOSEC OTC) 20 MG tablet Take 20 mg by mouth every 3 (three) days.    Marland Kitchen. ezetimibe (ZETIA) 10 MG tablet Take 1 tablet (10 mg total) by mouth daily. 90 tablet 3   No current facility-administered medications for this visit.      Allergies:   Horseradish [cochlearia armoracia]   Social History:  The patient  reports that he quit smoking about 28 years ago. His smoking use included cigarettes. He has a 2.50 pack-year smoking history. he has never used smokeless  tobacco. He reports that he drinks alcohol. He reports that he does not use drugs.   Family History:   family history includes Arthritis in his maternal grandfather and maternal grandmother; Cancer in his maternal grandmother and paternal uncle; Hyperlipidemia in his father and mother; Hypertension in his father and mother.    Review of Systems: Review of Systems  Constitutional: Negative.   Respiratory: Positive for shortness of breath.   Cardiovascular:  Negative.        Chest pressure  Gastrointestinal: Negative.   Musculoskeletal: Negative.   Neurological: Negative.   Psychiatric/Behavioral: Negative.   All other systems reviewed and are negative.    PHYSICAL EXAM: VS:  BP 130/84 (BP Location: Right Arm, Patient Position: Sitting, Cuff Size: Normal)   Pulse 80   Ht 5\' 6"  (1.676 m)   Wt 204 lb 8 oz (92.8 kg)   BMI 33.01 kg/m  , BMI Body mass index is 33.01 kg/m. GEN: Well nourished, well developed, in no acute distress  HEENT: normal  Neck: no JVD, carotid bruits, or masses Cardiac: RRR; no murmurs, rubs, or gallops,no edema  Respiratory:  clear to auscultation bilaterally, normal work of breathing GI: soft, nontender, nondistended, + BS MS: no deformity or atrophy  Skin: warm and dry, no rash Neuro:  Strength and sensation are intact Psych: euthymic mood, full affect    Recent Labs: 02/25/2017: Platelets 177; Potassium 4.6; Sodium 139 02/27/2017: BUN 11; Creatinine, Ser 1.05; Hemoglobin 14.0    Lipid Panel Lab Results  Component Value Date   CHOL 234 (H) 08/03/2016   HDL 32 (L) 08/03/2016   LDLCALC Comment 08/03/2016   TRIG 523 (H) 08/03/2016      Wt Readings from Last 3 Encounters:  10/20/17 204 lb 8 oz (92.8 kg)  07/19/17 190 lb (86.2 kg)  06/27/17 195 lb (88.5 kg)       ASSESSMENT AND PLAN:  Shortness of breath -  Etiology of his shortness of breath is unclear, Stress test ordered to rule out ischemia We have ordered stress echocardiogram,  He will attempt treadmill Unable to exclude weight gain and deconditioning  Does not appear to be in heart failure  Recommended regular exercise program and weight loss   Chest pain, unspecified type /chest pressure Atypical as well as typical features Seems to wax and wane, worse with hills and heavy exertion Treadmill stress echo ordered  Mixed hyperlipidemia Recommended he start Zetia 10 mg daily We will recheck lipids in 2-3 months with LFTs  Essential  hypertension Blood pressure is well controlled on today's visit. No changes made to the medications.  Aortic atherosclerosis Images reviewed with him in detail distal aorta common iliac arteries Stressed importance of aggressive diabetes control and LDL of 70 or less  Controlled type 2 diabetes mellitus with other circulatory complication, without long-term current use of insulin (HCC) Discussed diet with him Recommended close follow-up with primary care for additional medications He reports hemoglobin A1c of 10 We have ordered lab to recheck as none in our system  Disposition:   F/U as needed   Total encounter time more than 60 minutes  Greater than 50% was spent in counseling and coordination of care with the patient  Patient was seen in consultation for employee health/Susan Sherrie MustacheFisher and will be referred back to their office for ongoing care of the issues detailed above   Orders Placed This Encounter  Procedures  . Hepatic function panel  . Lipid Profile  . HgB A1c  .  EKG 12-Lead  . ECHOCARDIOGRAM STRESS TEST     Signed, Dossie Arbour, M.D., Ph.D. 10/20/2017  [facility] [place], [place] [phone]

## 2017-10-20 ENCOUNTER — Ambulatory Visit (INDEPENDENT_AMBULATORY_CARE_PROVIDER_SITE_OTHER): Payer: Managed Care, Other (non HMO) | Admitting: Cardiovascular Disease

## 2017-10-20 ENCOUNTER — Encounter: Payer: Self-pay | Admitting: Cardiovascular Disease

## 2017-10-20 VITALS — BP 130/84 | HR 80 | Ht 66.0 in | Wt 204.5 lb

## 2017-10-20 DIAGNOSIS — J45909 Unspecified asthma, uncomplicated: Secondary | ICD-10-CM | POA: Insufficient documentation

## 2017-10-20 DIAGNOSIS — R0789 Other chest pain: Secondary | ICD-10-CM | POA: Diagnosis not present

## 2017-10-20 DIAGNOSIS — R079 Chest pain, unspecified: Secondary | ICD-10-CM

## 2017-10-20 DIAGNOSIS — I700 Atherosclerosis of aorta: Secondary | ICD-10-CM | POA: Diagnosis not present

## 2017-10-20 DIAGNOSIS — E1159 Type 2 diabetes mellitus with other circulatory complications: Secondary | ICD-10-CM | POA: Diagnosis not present

## 2017-10-20 DIAGNOSIS — E782 Mixed hyperlipidemia: Secondary | ICD-10-CM

## 2017-10-20 DIAGNOSIS — R0602 Shortness of breath: Secondary | ICD-10-CM | POA: Diagnosis not present

## 2017-10-20 DIAGNOSIS — I10 Essential (primary) hypertension: Secondary | ICD-10-CM

## 2017-10-20 MED ORDER — EZETIMIBE 10 MG PO TABS: 10.0000 mg | ORAL_TABLET | Freq: Every day | ORAL | 3 refills | Status: DC

## 2017-10-20 NOTE — Patient Instructions (Addendum)
Medication Instructions:   Please add the zetia one a day Stay on lipitor  Labwork:  Lab slips provided for Liver and Lipid profile to be done in 3 months. Please make sure not to eat or drink anything after midnight prior and only small sip with morning pills.    Testing/Procedures:  We will order a stress echo for chest pain and shortness of breath Your physician has requested that you have a stress echocardiogram. For further information please visit [email or web address]. Please follow instruction sheet as given.   Do not drink or eat foods with caffeine for 24 hours before the test. (Chocolate, coffee, tea, or energy drinks)  If you use an inhaler, bring it with you to the test.  Do not smoke for 4 hours before the test.  Wear comfortable shoes and clothing.  Follow-Up: It was a pleasure seeing you in the office today. Please call us if you have new issues that need to be addressed before your next appt.  [phone]  Your physician wants you to follow-up in:  As needed  If you need a refill on your cardiac medications before your next appointment, please call your pharmacy.     Exercise Stress Echocardiogram An exercise stress echocardiogram is a test that checks how well your heart is working. For this test, you will walk on a treadmill to make your heart beat faster. This test uses sound waves (ultrasound) and a computer to make pictures (images) of your heart. These pictures will be taken before you exercise and after you exercise. What happens before the procedure?  Follow instructions from your doctor about what you cannot eat or drink before the test.  Do not drink or eat anything that has caffeine in it. Stop having caffeine for 24 hours before the test.  Ask your doctor about changing or stopping your normal medicines. This is important if you take diabetes medicines or blood thinners. Ask your doctor if you should take your medicines with water before the  test.  If you use an inhaler, bring it to the test.  Do not use any products that have nicotine or tobacco in them, such as cigarettes and e-cigarettes. Stop using them for 4 hours before the test. If you need help quitting, ask your doctor.  Wear comfortable shoes and clothing. What happens during the procedure?  You will be hooked up to a TV screen. Your doctor will watch the screen to see how fast your heart beats during the test.  Before you exercise, a computer will make a picture of your heart. To do this: ? A gel will be put on your chest. ? A wand will be moved over the gel. ? Sound waves from the wand will go to the computer to make the picture.  Your will start walking on a treadmill. The treadmill will start at a slow speed. It will get faster a little bit at a time. When you walk faster, your heart will beat faster.  The treadmill will be stopped when your heart is working hard.  You will lie down right away so another picture of your heart can be taken.  The test will take 30-60 minutes. What happens after the procedure?  Your heart rate and blood pressure will be watched after the test.  If your doctor says that you can, you may: ? Eat what you usually eat. ? Do your normal activities. ? Take medicines like normal. Summary  An exercise stress echocardiogram is a test  that checks how well your heart is working.  Follow instructions about what you cannot eat or drink before the test. Ask your doctor if you should take your normal medicines before the test.  Stop having caffeine for 24 hours before the test. Do not use anything with nicotine or tobacco in it for 4 hours before the test.  A computer will take a picture of your heart before you walk on a treadmill. It will take another picture when you are done walking.  Your heart rate and blood pressure will be watched after the test. This information is not intended to replace advice given to you by your health  care provider. Make sure you discuss any questions you have with your health care provider. Document Released: 09/04/2009 Document Revised: 07/31/2016 Document Reviewed: 07/31/2016 Elsevier Interactive Patient Education  2017 [employer]

## 2017-10-25 NOTE — Progress Notes (Signed)
Patient's Name: Caleb Warren R Farace  MRN: [id number]  Referring Provider: Glori LuisSonnenberg, Eric G, MD  DOB: 10/18/1961  PCP: Glori LuisSonnenberg, Eric G, MD  DOS: 10/26/2017  Note by: Oswaldo DoneFrancisco A Lendy Dittrich, MD  Service setting: Ambulatory outpatient  Specialty: Interventional Pain Management  Patient type: Established  Location: [facility] (AMB) Pain Management Facility  Visit type: Interventional Procedure   Primary Reason for Visit: Interventional Pain Management Treatment. CC: Hand Pain (left)  Procedure:  Anesthesia, Analgesia, Anxiolysis:  Type: Therapeutic Small Joint (CPT 20600) Steroid Injection Laterality: Left  Type: Local Anesthesia Local Anesthetic: Lidocaine 1% Route: Infiltration ([place]/IM) IV Access: Declined Sedation: Declined  Indication(s): Analgesia           Target Area/Region: Dorsal Interspace between CMC and Trapezium Target: CMC (Carpometacarpal) Patient position: Sitting Extremity position: Supine   Indications: 1. Arthritis of carpometacarpal (CMC) joint of left thumb   2. Arthritis of carpometacarpal joint   3. Chronic thumb pain (Left)   4. Left hand pain   5. Localized primary osteoarthritis of carpometacarpal joint of left thumb   6. Chronic thumb pain, left    Pain Score: Pre-procedure: 4 /10 Post-procedure: 0-No pain/10  Pre-op Assessment:  Caleb Warren is a 56 y.o. (year old), male patient, seen today for interventional treatment. He  has a past surgical history that includes Kidney stone surgery (1994); Colonoscopy (2016); IR NEPHROSTOMY PLACEMENT RIGHT (02/23/2017); Nephrolithotomy (Right, 02/24/2017); and Cystoscopy with stent placement (Right, 02/24/2017). Caleb Warren has a current medication list which includes the following prescription(s): amlodipine, aspirin, atorvastatin, ezetimibe, meloxicam, metformin, and omeprazole. His primarily concern today is the Hand Pain (left)  Initial Vital Signs: There were no vitals taken for this visit. BMI: Estimated body mass index is 32.28 kg/m  as calculated from the following:   Height as of this encounter: 5\' 6"  (1.676 m).   Weight as of this encounter: 200 lb (90.7 kg).  Risk Assessment: Allergies: Reviewed. 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  Allergy Precautions: None required Coagulopathies: Reviewed. None identified.  Blood-thinner therapy: None at this time Active Infection(s): Reviewed. None identified. Caleb Warren is afebrile  Site Confirmation: Caleb Warren was asked to confirm the procedure and laterality before marking the site Procedure checklist: Completed Consent: Before the procedure and under the influence of no sedative(s), amnesic(s), or anxiolytics, the patient was informed of the treatment options, risks and possible complications. To fulfill our ethical and legal obligations, as recommended by the American Medical Association's Code of Ethics, I have informed the patient of my clinical impression; the nature and purpose of the treatment or procedure; the risks, benefits, and possible complications of the intervention; the alternatives, including doing nothing; the risk(s) and benefit(s) of the alternative treatment(s) or procedure(s); and the risk(s) and benefit(s) of doing nothing. The patient was provided information about the general risks and possible complications associated with the procedure. These may include, but are not limited to: failure to achieve desired goals, infection, bleeding, organ or nerve damage, allergic reactions, paralysis, and death. In addition, the patient was informed of those risks and complications associated to the procedure, such as failure to decrease pain; infection; bleeding; organ or nerve damage with subsequent damage to sensory, motor, and/or autonomic systems, resulting in permanent pain, numbness, and/or weakness of one or several areas of the body; allergic reactions; (i.e.: anaphylactic reaction); and/or death. Furthermore, the patient was informed of those risks  and complications associated with the medications. These include, but are not limited to: allergic reactions (i.e.: anaphylactic or  anaphylactoid reaction(s)); adrenal axis suppression; blood sugar elevation that in diabetics may result in ketoacidosis or comma; water retention that in patients with history of congestive heart failure may result in shortness of breath, pulmonary edema, and decompensation with resultant heart failure; weight gain; swelling or edema; medication-induced neural toxicity; particulate matter embolism and blood vessel occlusion with resultant organ, and/or nervous system infarction; and/or aseptic necrosis of one or more joints. Finally, the patient was informed that Medicine is not an exact science; therefore, there is also the possibility of unforeseen or unpredictable risks and/or possible complications that may result in a catastrophic outcome. The patient indicated having understood very clearly. We have given the patient no guarantees and we have made no promises. Enough time was given to the patient to ask questions, all of which were answered to the patient's satisfaction. Mr. Hoecker has indicated that he wanted to continue with the procedure. Attestation: I, the ordering provider, attest that I have discussed with the patient the benefits, risks, side-effects, alternatives, likelihood of achieving goals, and potential problems during recovery for the procedure that I have provided informed consent. Date: 10/26/2017; Time: 10:14 PM  Pre-Procedure Preparation:  Monitoring: As per clinic protocol. Respiration, ETCO2, SpO2, BP, heart rate and rhythm monitor placed and checked for adequate function Safety Precautions: Patient was assessed for positional comfort and pressure points before starting the procedure. Time-out: I initiated and conducted the "Time-out" before starting the procedure, as per protocol. The patient was asked to participate by confirming the accuracy of the  "Time Out" information. Verification of the correct person, site, and procedure were performed and confirmed by me, the nursing staff, and the patient. "Time-out" conducted as per Joint Commission's Universal Protocol (UP.01.01.01). "Time-out" Date & Time: 10/26/2017; 0823 hrs.  Description of Procedure Process:   Approach: Dorsal approach. Area Prepped: Entire hand and wrist area Prepping solution: ChloraPrep (2% chlorhexidine gluconate and 70% isopropyl alcohol) Safety Precautions: Aspiration looking for blood return was conducted prior to all injections. At no point did we inject any substances, as a needle was being advanced. No attempts were made at seeking any paresthesias. Safe injection practices and needle disposal techniques used. Medications properly checked for expiration dates. SDV (single dose vial) medications used. Description of the Procedure: Protocol guidelines were followed. The patient was placed in position. The target area was identified and the area prepped in the usual manner. Skin & deeper tissues infiltrated with local anesthetic. Appropriate amount of time allowed to pass for local anesthetics to take effect. The procedure needles were then advanced to the target area. Proper needle placement secured. Negative aspiration confirmed. Solution injected in intermittent fashion, asking for systemic symptoms every 0.5cc of injectate. The needles were then removed and the area cleansed, making sure to leave some of the prepping solution back to take advantage of its long term bactericidal properties.      Vitals:   10/26/17 0746 10/26/17 0825 10/26/17 0830  BP: (!) 150/97 (!) 149/99 (!) 143/98  Pulse: 79    Resp: 16 16 16   Temp: 98.1 F (36.7 C)    SpO2: 98%    Weight: 200 lb (90.7 kg)    Height: 5\' 6"  (1.676 m)      Start Time: 0823 hrs. End Time: 0830 hrs. Materials:  Needle(s) Type: Regular needle Gauge: 22G Length: 3.5-in Medication(s): We administered  methylPREDNISolone acetate, lidocaine, and ropivacaine (PF) 2 mg/mL (0.2%). Please see chart orders for dosing details.  Imaging Guidance:  Type of Imaging Technique:  Fluoroscopy Guidance (Non-spinal) Indication(s): Assistance in needle guidance and placement for procedures requiring needle placement in or near specific anatomical locations not easily accessible without such assistance. Exposure Time: Please see nurses notes. Contrast: None used. Fluoroscopic Guidance: I was personally present during the use of fluoroscopy. "Tunnel Vision Technique" used to obtain the best possible view of the target area. Parallax error corrected before commencing the procedure. "Direction-depth-direction" technique used to introduce the needle under continuous pulsed fluoroscopy. Once target was reached, antero-posterior, oblique, and lateral fluoroscopic projection used confirm needle placement in all planes. Images permanently stored in EMR. Ultrasound Guidance: N/A Interpretation: I personally interpreted the imaging intraoperatively. Adequate needle placement confirmed in multiple planes. Appropriate spread of contrast into desired area was observed. No evidence of afferent or efferent intravascular uptake. No intrathecal or subarachnoid spread observed. Permanent images saved into the patient's record.   Images from last successful procedure:   Antibiotic Prophylaxis:  Indication(s): None identified Antibiotic given: None  Post-operative Assessment:  EBL: None Complications: No immediate post-treatment complications observed by team, or reported by patient. Note: The patient tolerated the entire procedure well. A repeat set of vitals were taken after the procedure and the patient was kept under observation following institutional policy, for this type of procedure. Post-procedural neurological assessment was performed, showing return to baseline, prior to discharge. The patient was provided with  post-procedure discharge instructions, including a section on how to identify potential problems. Should any problems arise concerning this procedure, the patient was given instructions to immediately contact us, at any time, without hesitation. In any case, we plan to contact the patient by telephone for a follow-up status report regarding this interventional procedure. Comments:  No additional relevant information.  Plan of Care    Imaging Orders     DG C-Arm 1-60 Min-No Report  Procedure Orders     Small Joint Injection/Arthrocentesis  Medications ordered for procedure: Meds ordered this encounter  Medications  . methylPREDNISolone acetate (DEPO-MEDROL) injection 80 mg  . lidocaine (XYLOCAINE) 2 % (with pres) injection 100 mg  . ropivacaine (PF) 2 mg/mL (0.2%) (NAROPIN) injection 2 mL   Medications administered: We administered methylPREDNISolone acetate, lidocaine, and ropivacaine (PF) 2 mg/mL (0.2%).  See the medical record for exact dosing, route, and time of administration.  This SmartLink is deprecated. Use AVSMEDLIST instead to display the medication list for a patient. Disposition: Discharge home  Discharge Date & Time: 10/26/2017;   hrs.   Physician-requested Follow-up: Return for post-procedure eval by Dr. Laban Emperor in 2 wks. Future Appointments  Date Time Provider Department Center  10/26/2017  1:45 PM Delano Metz, MD [facility] None  10/30/2017  9:00 AM [facility] None  11/16/2017  2:00 PM [facility] [place] [facility]  04/05/2018  8:45 AM [facility] None   Primary Care Physician: Glori Luis, MD Location: [facility] Note by: Oswaldo Done, MD Date: 10/26/2017; Time: 8:35 AM  Disclaimer:  Medicine is not an exact science. The only guarantee in medicine is that nothing is guaranteed. It is important to note that the decision to proceed with this intervention was based on the  information collected from the patient. The Data and conclusions were drawn from the patient's questionnaire, the interview, and the physical examination. Because the information was provided in large part by the patient, it cannot be guaranteed that it has not been purposely or unconsciously manipulated. Every effort has been made to obtain as much relevant data  as possible for this evaluation. It is important to note that the conclusions that lead to this procedure are derived in large part from the available data. Always take into account that the treatment will also be dependent on availability of resources and existing treatment guidelines, considered by other Pain Management Practitioners as being common knowledge and practice, at the time of the intervention. For Medico-Legal purposes, it is also important to point out that variation in procedural techniques and pharmacological choices are the acceptable norm. The indications, contraindications, technique, and results of the above procedure should only be interpreted and judged by a Board-Certified Interventional Pain Specialist with extensive familiarity and expertise in the same exact procedure and technique.

## 2017-10-26 ENCOUNTER — Other Ambulatory Visit: Payer: Self-pay

## 2017-10-26 ENCOUNTER — Ambulatory Visit
Admission: RE | Admit: 2017-10-26 | Discharge: 2017-10-26 | Disposition: A | Discharge status: Home / Self Care | Payer: Managed Care, Other (non HMO) | Source: Ambulatory Visit | Attending: Pain Medicine | Admitting: Pain Medicine

## 2017-10-26 ENCOUNTER — Ambulatory Visit (HOSPITAL_BASED_OUTPATIENT_CLINIC_OR_DEPARTMENT_OTHER): Payer: Managed Care, Other (non HMO) | Admitting: Pain Medicine

## 2017-10-26 ENCOUNTER — Encounter: Payer: Self-pay | Admitting: Pain Medicine

## 2017-10-26 VITALS — BP 143/98 | HR 79 | Temp 98.1°F | Resp 16 | Ht 66.0 in | Wt 200.0 lb

## 2017-10-26 DIAGNOSIS — M1812 Unilateral primary osteoarthritis of first carpometacarpal joint, left hand: Secondary | ICD-10-CM | POA: Insufficient documentation

## 2017-10-26 DIAGNOSIS — Z7982 Long term (current) use of aspirin: Secondary | ICD-10-CM | POA: Insufficient documentation

## 2017-10-26 DIAGNOSIS — M79642 Pain in left hand: Secondary | ICD-10-CM | POA: Diagnosis present

## 2017-10-26 DIAGNOSIS — M19049 Primary osteoarthritis, unspecified hand: Secondary | ICD-10-CM

## 2017-10-26 DIAGNOSIS — Z7984 Long term (current) use of oral hypoglycemic drugs: Secondary | ICD-10-CM | POA: Diagnosis not present

## 2017-10-26 DIAGNOSIS — G8929 Other chronic pain: Secondary | ICD-10-CM

## 2017-10-26 DIAGNOSIS — Z87442 Personal history of urinary calculi: Secondary | ICD-10-CM | POA: Insufficient documentation

## 2017-10-26 DIAGNOSIS — Z79899 Other long term (current) drug therapy: Secondary | ICD-10-CM | POA: Diagnosis not present

## 2017-10-26 DIAGNOSIS — M79645 Pain in left finger(s): Secondary | ICD-10-CM | POA: Diagnosis not present

## 2017-10-26 MED ORDER — LIDOCAINE HCL 2 % IJ SOLN
INTRAMUSCULAR | Status: AC
  Filled 2017-10-26: qty 20

## 2017-10-26 MED ORDER — METHYLPREDNISOLONE ACETATE 80 MG/ML IJ SUSP
80.0000 mg | Freq: Once | INTRAMUSCULAR | Status: AC
  Administered 2017-10-26: 80 mg
  Filled 2017-10-26: qty 1

## 2017-10-26 MED ORDER — LIDOCAINE HCL 2 % IJ SOLN
5.0000 mL | Freq: Once | INTRAMUSCULAR | Status: AC
  Administered 2017-10-26: 100 mg

## 2017-10-26 MED ORDER — ROPIVACAINE HCL 2 MG/ML IJ SOLN
INTRAMUSCULAR | Status: AC
  Filled 2017-10-26: qty 10

## 2017-10-26 MED ORDER — ROPIVACAINE HCL 2 MG/ML IJ SOLN
2.0000 mL | Freq: Once | INTRAMUSCULAR | Status: AC
  Administered 2017-10-26: 2 mL

## 2017-10-26 NOTE — Patient Instructions (Signed)

## 2017-10-30 ENCOUNTER — Other Ambulatory Visit: Payer: Self-pay

## 2017-10-31 ENCOUNTER — Ambulatory Visit: Payer: Self-pay | Admitting: Nurse Practitioner

## 2017-11-01 ENCOUNTER — Other Ambulatory Visit: Payer: Self-pay

## 2017-11-08 ENCOUNTER — Other Ambulatory Visit: Payer: Self-pay | Admitting: Physician Assistant

## 2017-11-08 VITALS — BP 150/90

## 2017-11-08 DIAGNOSIS — Z299 Encounter for prophylactic measures, unspecified: Secondary | ICD-10-CM

## 2017-11-08 MED ORDER — AMLODIPINE BESYLATE 10 MG PO TABS: 10.0000 mg | ORAL_TABLET | Freq: Every day | ORAL | 3 refills | Status: DC

## 2017-11-08 NOTE — Addendum Note (Signed)
Addended by: Catha BrowEACON, MONIQUE T on: 11/08/2017 08:35 AM   Modules accepted: Orders

## 2017-11-08 NOTE — Progress Notes (Signed)
   Subjective: Hypertension/medication refill     Patient ID: Caleb Warren, male    DOB: 02/14/1961, 56 y.o.   MRN: [id number]  HPI Patient here for reevaluation of hypertension. Patient medication was changed from lisinopril to Norvasc. Patient to monitor blood pressure and state others improve he still running systolic of 150. Patient denies any side effects of medication.  Review of Systems    hypertension diabetes Objective:   Physical Exam HEENT unremarkable. Neck is supple without adenopathy. Lungs clear to auscultation heart is regular rate and rhythm.       Assessment & Plan: Hypertension/medication refill.   Patient Norvasc for increased from 5 mg to 10 mg. Patient advised continue monitoring blood pressure for 2 weeks. Advised follow up PCP.

## 2017-11-09 LAB — LIPID PANEL
CHOLESTEROL TOTAL: 191 mg/dL (ref 100–199)
Chol/HDL Ratio: 4.1 ratio (ref 0.0–5.0)
HDL: 47 mg/dL (ref >39)
LDL Calculated: 106 mg/dL — ABNORMAL HIGH (ref 0–99)
Triglycerides: 188 mg/dL — ABNORMAL HIGH (ref 0–149)
VLDL CHOLESTEROL CAL: 38 mg/dL (ref 5–40)

## 2017-11-09 LAB — HGB A1C W/O EAG: HEMOGLOBIN A1C: 7.2 % — AB (ref 4.8–5.6)

## 2017-11-09 LAB — HEPATIC FUNCTION PANEL
ALK PHOS: 75 IU/L (ref 39–117)
ALT: 25 IU/L (ref 0–44)
AST: 17 IU/L (ref 0–40)
Albumin: 4.6 g/dL (ref 3.5–5.5)
BILIRUBIN, DIRECT: 0.11 mg/dL (ref 0.00–0.40)
Bilirubin Total: 0.4 mg/dL (ref 0.0–1.2)
Total Protein: 7.2 g/dL (ref 6.0–8.5)

## 2017-11-15 ENCOUNTER — Telehealth: Payer: Self-pay | Admitting: Cardiovascular Disease

## 2017-11-15 NOTE — Telephone Encounter (Signed)
I called and spoke with the patient regarding his stress echo instructions. He is aware of detailed instructions for his appointment as well as the time of his procedure. Patient verbalizes understanding.

## 2017-11-16 ENCOUNTER — Ambulatory Visit (INDEPENDENT_AMBULATORY_CARE_PROVIDER_SITE_OTHER): Payer: Managed Care, Other (non HMO)

## 2017-11-16 DIAGNOSIS — R079 Chest pain, unspecified: Secondary | ICD-10-CM | POA: Diagnosis not present

## 2017-11-16 DIAGNOSIS — R0602 Shortness of breath: Secondary | ICD-10-CM | POA: Diagnosis not present

## 2017-11-16 LAB — ECHOCARDIOGRAM STRESS TEST
CSEPED: 10 min
CSEPEDS: 4 s
CSEPEW: 11.7 METS
CSEPHR: 90 %
CSEPPHR: 150 {beats}/min
MPHR: 166 {beats}/min
Rest HR: 82 {beats}/min

## 2018-01-20 IMAGING — CT CT ABD-PELV W/ CM
2 of 5 series · 15 of 46 positions shown, 17 images · IV contrast (APPLIED)
Comparison: 01/26/2017

CLINICAL DATA: Interval removal of nephrostomy tubes. Swelling and
bruising at nephrostomy tube site.

EXAM:
CT ABDOMEN AND PELVIS WITH CONTRAST
TECHNIQUE: Multidetector CT imaging of the abdomen and pelvis was performed
using the standard protocol following bolus administration of
intravenous contrast.
CONTRAST:  100mL X8ZANW-WWW IOPAMIDOL (X8ZANW-WWW) INJECTION 61%

[Series 2: routine abd/pel with · axial · 0.87mm/px · z∈[-962,-542]mm · 12 of 100 slices shown, 14 images]
[im 8/100  soft-tissue]
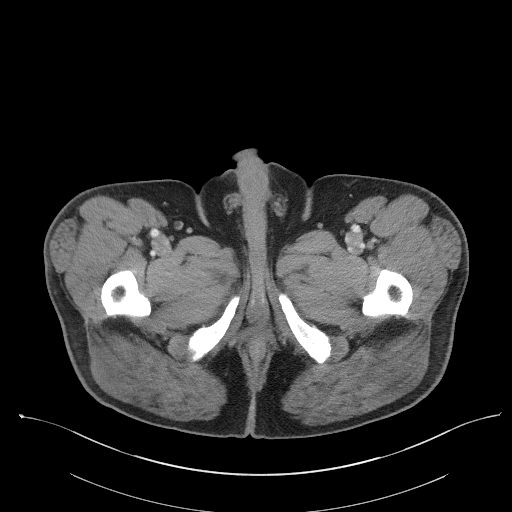
[im 8/100  bone]
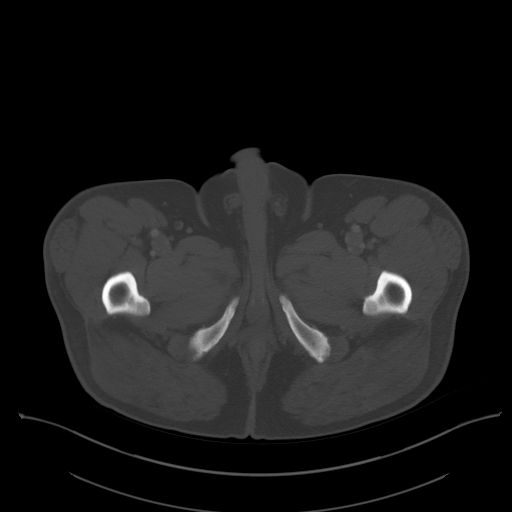
[im 15/100  soft-tissue]
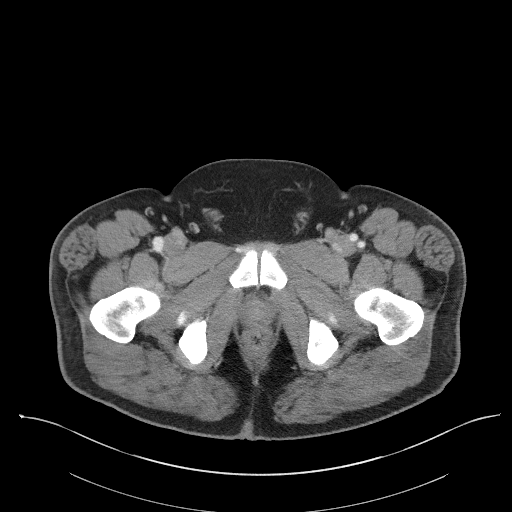
[im 22/100  soft-tissue]
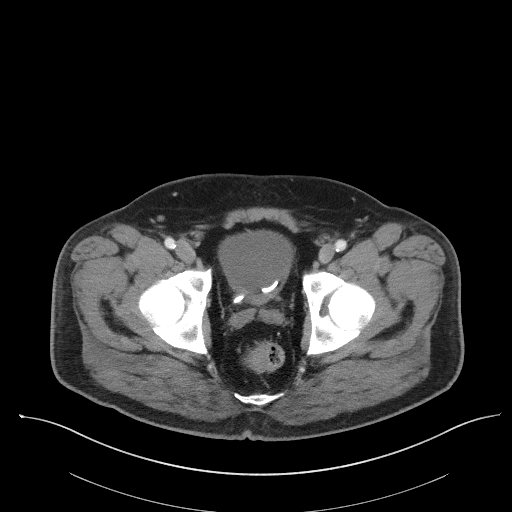
[im 29/100  soft-tissue]
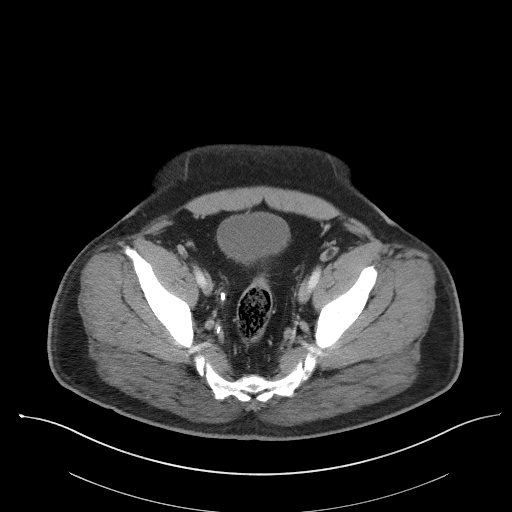
[im 36/100  soft-tissue]
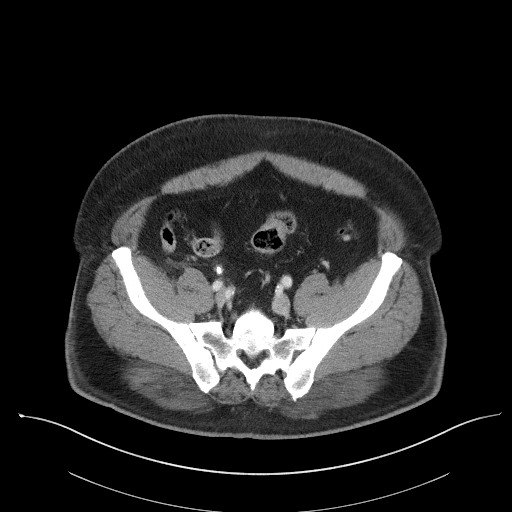
[im 43/100  soft-tissue]
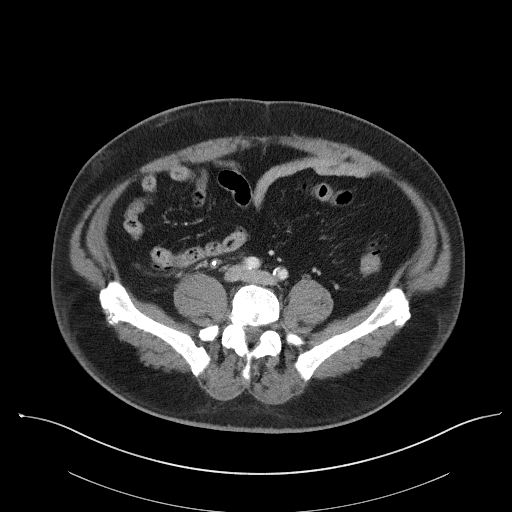
[im 57/100  soft-tissue]
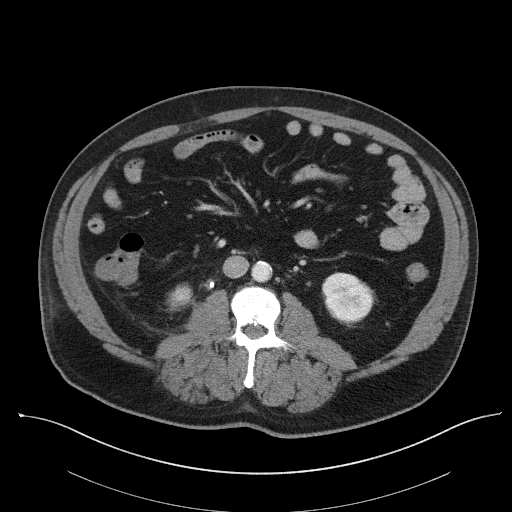
[im 64/100  soft-tissue]
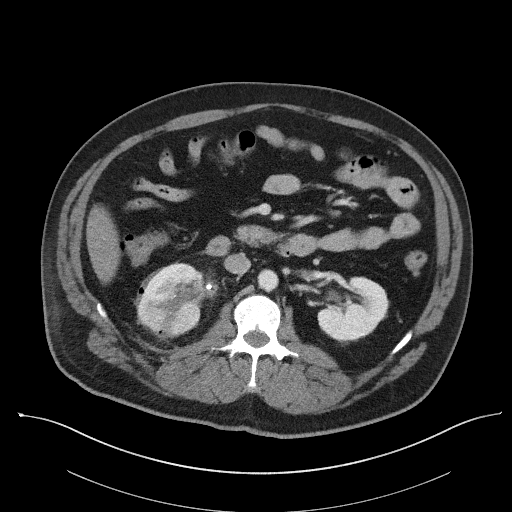
[im 71/100  soft-tissue]
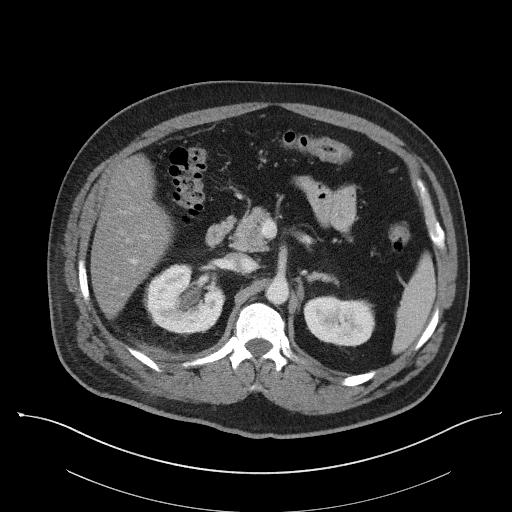
[im 71/100  bone]
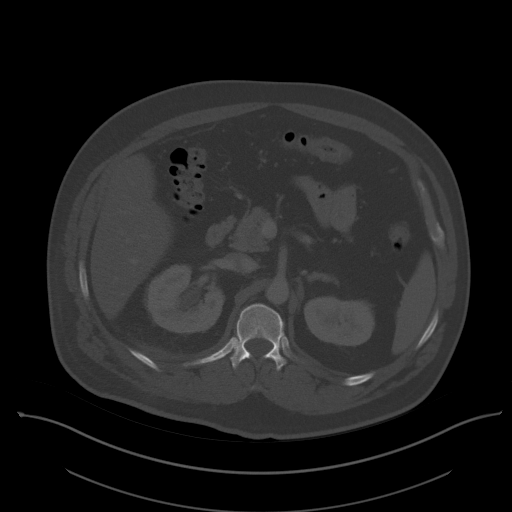
[im 78/100  soft-tissue]
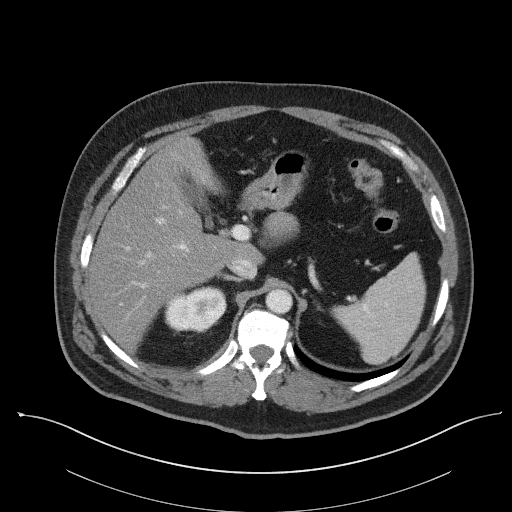
[im 85/100  soft-tissue]
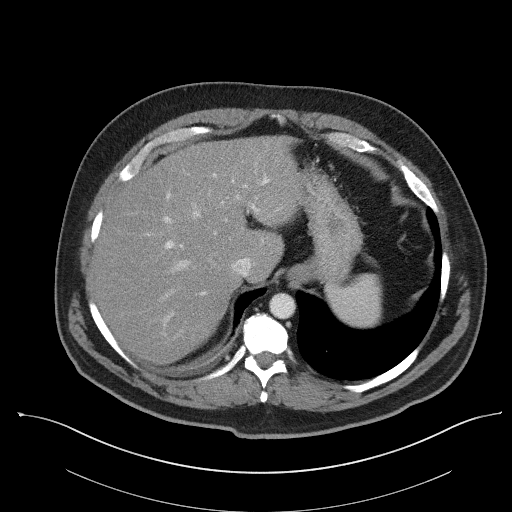
[im 92/100  soft-tissue]
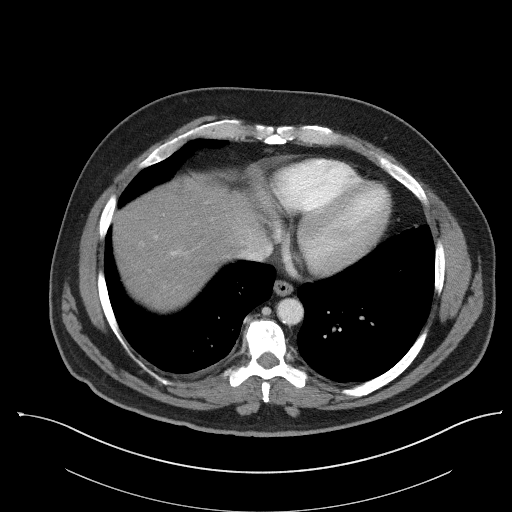

[Series 5: coronal st · coronal · 0.84mm/px · 3 of 99 slices shown]
[im 33/99  soft-tissue]
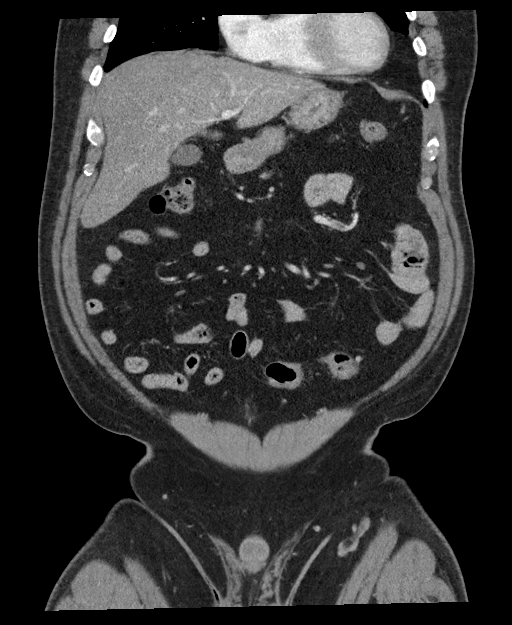
[im 44/99  soft-tissue]
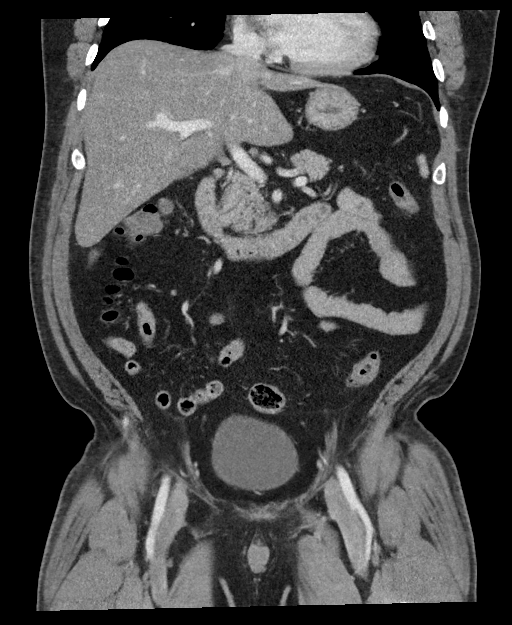
[im 55/99  soft-tissue]
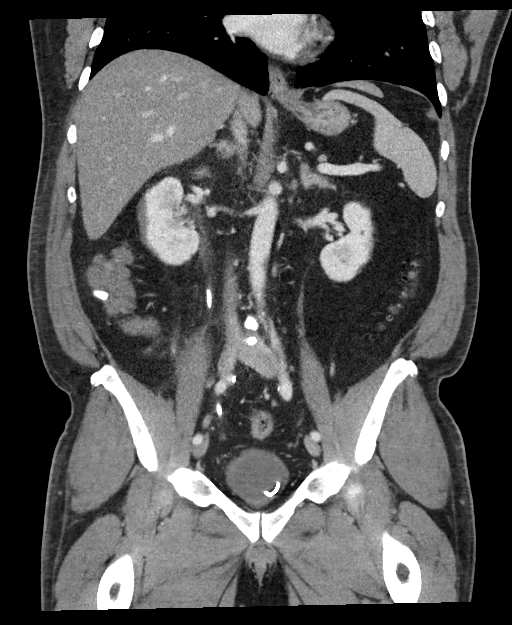

[15 of 46 positions shown; findings below may reference images not displayed]

FINDINGS: Lower chest: Very small right pleural effusion is identified. The
lung bases are otherwise clear

Hepatobiliary: Mild diffuse hepatic steatosis. No suspicious liver
abnormality.Gallbladder appears normal. No biliary dilatation.

Pancreas: Unremarkable. No pancreatic ductal dilatation or
surrounding inflammatory changes.

Spleen: Normal in size without focal abnormality.

Adrenals/Urinary Tract: Adrenal glands are unremarkable. The adrenal
glands are unremarkable. Several small low-density lesions within
the left kidney are far too small to reliably characterize. Several
small left renal calculi are noted. The largest is in the inferior
pole measuring 4 mm, image 41 of series 2.

There stent removal of the right-sided percutaneous nephrostomy tube
with postprocedural changes involving the posterior and lateral
lower pole of the right kidney. Small subcapsular fluid collection
overlying the anterolateral right kidney measures 0.5 x 2.9 x
cm. Several small stone fragments are identified within the right
renal collecting system. The right-sided ureteral stent is in place
without significant hydronephrosis. No large perinephric fluid
collections or urinoma is identified. The right renal vein remains
patent.

Stomach/Bowel: Stomach is within normal limits. Appendix appears
normal. No evidence of bowel wall thickening, distention, or
inflammatory changes.

Vascular/Lymphatic: Aortic atherosclerosis. No enlarged abdominal or
pelvic lymph nodes.

Reproductive: Prostate is unremarkable.

Other: No abdominal wall hernia or abnormality. No abdominopelvic
ascites.

Musculoskeletal: No acute or significant osseous findings.
IMPRESSION: 1. There are postprocedural changes involving the inferior pole of
the right kidney compatible with recent nephrostomy and percutaneous
nephrolithotomy. No significant complication identified. No
abdominal wall fluid collection or mass identified.
2. Several small stone fragments are identified within the right
renal collecting system.
3. Small right-sided subcapsular fluid collection.
4. Aortic atherosclerosis.

## 2018-03-22 ENCOUNTER — Other Ambulatory Visit: Payer: Self-pay

## 2018-03-22 MED ORDER — ATORVASTATIN CALCIUM 40 MG PO TABS
40.0000 mg | ORAL_TABLET | Freq: Every day | ORAL | 0 refills | Status: DC
Start: 2018-03-22 — End: 2018-04-04

## 2018-03-22 MED ORDER — METFORMIN HCL 500 MG PO TABS: ORAL_TABLET | ORAL | 0 refills | Status: DC

## 2018-03-22 NOTE — Telephone Encounter (Signed)
30 day refill sent to pharmacy. He needs to keep his appointment to receive further refills.

## 2018-03-22 NOTE — Telephone Encounter (Signed)
Last OV 07/21/16 last filled  Atorvastatin 08/18/17 90 1rf Metformin 08/18/17 180 1rf  scheduled OV 04/09/18

## 2018-03-27 ENCOUNTER — Other Ambulatory Visit: Payer: Self-pay | Admitting: Family Medicine

## 2018-04-04 ENCOUNTER — Other Ambulatory Visit: Payer: Self-pay

## 2018-04-04 MED ORDER — METFORMIN HCL 500 MG PO TABS: ORAL_TABLET | ORAL | 0 refills | Status: DC

## 2018-04-04 MED ORDER — ATORVASTATIN CALCIUM 40 MG PO TABS: 40.0000 mg | ORAL_TABLET | Freq: Every day | ORAL | 0 refills | Status: DC

## 2018-04-05 ENCOUNTER — Ambulatory Visit: Payer: Managed Care, Other (non HMO)

## 2018-04-09 ENCOUNTER — Encounter: Payer: Self-pay | Admitting: Family Medicine

## 2018-04-09 ENCOUNTER — Other Ambulatory Visit: Payer: Self-pay | Admitting: Family Medicine

## 2018-04-09 ENCOUNTER — Other Ambulatory Visit: Payer: Self-pay

## 2018-04-09 ENCOUNTER — Ambulatory Visit (INDEPENDENT_AMBULATORY_CARE_PROVIDER_SITE_OTHER): Payer: Managed Care, Other (non HMO)

## 2018-04-09 ENCOUNTER — Ambulatory Visit (INDEPENDENT_AMBULATORY_CARE_PROVIDER_SITE_OTHER): Payer: Managed Care, Other (non HMO) | Admitting: Family Medicine

## 2018-04-09 VITALS — BP 132/90 | HR 62 | Temp 97.7°F | Ht 65.5 in | Wt 201.4 lb

## 2018-04-09 DIAGNOSIS — Z125 Encounter for screening for malignant neoplasm of prostate: Secondary | ICD-10-CM

## 2018-04-09 DIAGNOSIS — E1159 Type 2 diabetes mellitus with other circulatory complications: Secondary | ICD-10-CM

## 2018-04-09 DIAGNOSIS — Z0001 Encounter for general adult medical examination with abnormal findings: Secondary | ICD-10-CM | POA: Diagnosis not present

## 2018-04-09 DIAGNOSIS — R0609 Other forms of dyspnea: Secondary | ICD-10-CM

## 2018-04-09 DIAGNOSIS — I10 Essential (primary) hypertension: Secondary | ICD-10-CM

## 2018-04-09 LAB — PSA: PSA: 0.27 ng/mL (ref 0.10–4.00)

## 2018-04-09 LAB — COMPREHENSIVE METABOLIC PANEL
ALT: 24 U/L (ref 0–53)
AST: 21 U/L (ref 0–37)
Albumin: 4.6 g/dL (ref 3.5–5.2)
Alkaline Phosphatase: 64 U/L (ref 39–117)
BILIRUBIN TOTAL: 0.7 mg/dL (ref 0.2–1.2)
BUN: 15 mg/dL (ref 6–23)
CO2: 27 meq/L (ref 19–32)
Calcium: 9.4 mg/dL (ref 8.4–10.5)
Chloride: 104 mEq/L (ref 96–112)
Creatinine, Ser: 0.96 mg/dL (ref 0.40–1.50)
GFR: 86.61 mL/min (ref >60.00)
GLUCOSE: 141 mg/dL — AB (ref 70–99)
Potassium: 4.5 mEq/L (ref 3.5–5.1)
SODIUM: 140 meq/L (ref 135–145)
TOTAL PROTEIN: 7.6 g/dL (ref 6.0–8.3)

## 2018-04-09 LAB — HEMOGLOBIN A1C: Hgb A1c MFr Bld: 7.6 % — ABNORMAL HIGH (ref 4.6–6.5)

## 2018-04-09 LAB — LDL CHOLESTEROL, DIRECT: Direct LDL: 83 mg/dL

## 2018-04-09 NOTE — Progress Notes (Signed)
Tommi Rumps, MD Phone: [phone]  Caleb Warren is a 57 y.o. male who presents today for CPE,  Exercises by walking and doing yard work. Eats fairly healthfully.  No fried foods.  Eats vegetables and chicken. Colonoscopy up-to-date 07/24/2014 with 10-year recall. Due for prostate cancer screening. Tetanus vaccination up-to-date.  He has not gotten Shingrix yet. Hepatitis C and HIV testing up-to-date. He smoked for 5 years and quit in 7408.  1-2 alcoholic beverages a month.  No illicit drug use. Sees a dentist every 6 months.  Sees an ophthalmologist once a year.  Patient reports he was evaluated by cardiology late last year for exertional chest pain and shortness of breath.  He notes at times he will have symptoms when he is just walking to the car though at other times he can walk for miles or cut his grass with a push mower and not have any symptoms.  He notes it hurts under his sternum when it occurs.  He notes no wheezing.  He notes typically symptoms will go away within 5 minutes after resting.  He does note he caught a cold earlier this year and his symptoms seem to worsen with that.  Patient had a stress echo that showed no findings of ischemia and had a normal EF.  Active Ambulatory Problems    Diagnosis Date Noted  . Diabetes mellitus type 2, controlled ([place]) 05/25/2012  . Hypertension 05/25/2012  . Hyperlipidemia 05/25/2012  . Low testosterone 07/12/2012  . 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 Obesity (BMI 30-39.9) 05/20/2014  . Lipid screening 05/20/2014  . Encounter for general adult medical examination with abnormal findings 07/21/2016  . Left hand pain 12/14/2016  . Sesamoiditis (Left) 12/14/2016  . Arthritis associated with diabetes ([place]) 12/15/2016  . Bilateral hand pain 12/15/2016  . Nephrolithiasis 02/24/2017  . Chronic thumb pain (Left) 06/26/2017  . Localized primary osteoarthritis of carpometacarpal joint of left thumb 06/27/2017  . Arthritis of  carpometacarpal [facility]) joint of left thumb 06/27/2017  . Arthritis of carpometacarpal joint 06/27/2017  . Exertional dyspnea 10/20/2017  . Chest pressure 10/20/2017  . Aortic atherosclerosis ([place]) 10/20/2017   Resolved Ambulatory Problems    Diagnosis Date Noted  . Fatigue 05/25/2012  . Obesity (BMI 30-39.9) 05/25/2012  . Skin lesion of scalp 07/12/2012  . Other malaise and fatigue 03/07/2013  . Decreased libido 07/24/2013  . Overweight (BMI 25.0-29.9) 12/12/2013  . Acute nonsuppurative otitis media of right ear 05/20/2014   Past Medical History:  Diagnosis Date  . Arthritis   . Chicken pox   . Diabetes ([place])   . Difficult intubation   . Diverticulitis   . GERD (gastroesophageal reflux disease)   . History of kidney stones   . Hyperlipidemia   . Hypertension     Family History  Problem Relation Age of Onset  . Hyperlipidemia Mother   . Hypertension Mother   . Hyperlipidemia Father   . Hypertension Father   . Arthritis Maternal Grandmother   . Cancer Maternal Grandmother        breast  . Arthritis Maternal Grandfather   . Cancer Paternal Uncle        colon  . Prostate cancer Neg Hx   . Kidney cancer Neg Hx   . Bladder Cancer Neg Hx     Social History   Socioeconomic History  . Marital status: Married    Spouse name: Not on file  . Number of children: Not on file  . Years of  education: Not on file  . Highest education level: Not on file  Occupational History  . Not on file  Social Needs  . Financial resource strain: Not on file  . Food insecurity:    Worry: Not on file    Inability: Not on file  . Transportation needs:    Medical: Not on file    Non-medical: Not on file  Tobacco Use  . Smoking status: Former Smoker    Packs/day: 0.50    Years: 5.00    Pack years: 2.50    Types: Cigarettes    Last attempt to quit: 11/21/1988    Years since quitting: 29.4  . Smokeless tobacco: Never Used  Substance and Sexual Activity  . Alcohol use: Yes    Comment:  occ  . Drug use: No  . Sexual activity: Not on file  Lifestyle  . Physical activity:    Days per week: Not on file    Minutes per session: Not on file  . Stress: Not on file  Relationships  . Social connections:    Talks on phone: Not on file    Gets together: Not on file    Attends religious service: Not on file    Active member of club or organization: Not on file    Attends meetings of clubs or organizations: Not on file    Relationship status: Not on file  . Intimate partner violence:    Fear of current or ex partner: Not on file    Emotionally abused: Not on file    Physically abused: Not on file    Forced sexual activity: Not on file  Other Topics Concern  . Not on file  Social History Narrative   Lives in [place]. No pets. Works for [employer].      Diet - regular diet, limited red meat   Exercise - walks    ROS  General:  Negative for nexplained weight loss, fever Skin: Negative for new or changing mole, sore that won't heal HEENT: Negative for trouble hearing, trouble seeing, ringing in ears, mouth sores, hoarseness, change in voice, dysphagia. CV: Positive for chest pain, dyspnea, negative for edema, palpitations Resp: Positive for cough, dyspnea, negative for hemoptysis GI: Negative for nausea, vomiting, diarrhea, constipation, abdominal pain, melena, hematochezia. GU: Negative for dysuria, incontinence, urinary hesitance, hematuria, vaginal or penile discharge, polyuria, sexual difficulty, lumps in testicle or breasts MSK: Negative for muscle cramps or aches, joint pain or swelling Neuro: Negative for headaches, weakness, numbness, dizziness, passing out/fainting Psych: Negative for depression, anxiety, memory problems  Objective  Physical Exam Vitals:   04/09/18 1026  BP: 132/90  Pulse: 62  Temp: 97.7 F (36.5 C)  SpO2: 97%    BP Readings from Last 3 Encounters:  04/09/18 132/90  11/08/17 (!) 150/90  10/26/17 (!) 143/98   Wt  Readings from Last 3 Encounters:  04/09/18 201 lb 6.4 oz (91.4 kg)  10/26/17 200 lb (90.7 kg)  10/20/17 204 lb 8 oz (92.8 kg)    Physical Exam  Constitutional: No distress.  HENT:  Head: Normocephalic and atraumatic.  Mouth/Throat: Oropharynx is clear and moist.  Eyes: Pupils are equal, round, and reactive to light. Conjunctivae are normal.  Neck: Neck supple.  Cardiovascular: Normal rate, regular rhythm and normal heart sounds.  Pulmonary/Chest: Effort normal and breath sounds normal. He exhibits no tenderness.  Abdominal: Soft. Bowel sounds are normal. He exhibits no distension. There is no tenderness.  Musculoskeletal: He exhibits no edema.  Lymphadenopathy:    He has no cervical adenopathy.  Neurological: He is alert.  Skin: Skin is warm and dry. He is not diaphoretic.  Psychiatric: He has a normal mood and affect.     Assessment/Plan:   Encounter for general adult medical examination with abnormal findings Physical exam completed.  He will continue to stay active and monitor his diet.  He will check with his insurance regarding Shingrix coverage.  Lab work as outlined below.  Exertional dyspnea Symptoms with somewhat of a mixed picture concerning for cardiac cause though also some atypical components.  Symptoms do not consistently occur with fairly significant amounts of exertion though can occur with minimal exertion at times.  Reassuring that his stress echo was normal.  We will obtain a chest x-ray and PFTs.  We will have him see his cardiologist again to see if catheterization would be necessary.  Hypertension Patient reports his blood pressure is always less than 130/80 when he checks it at home.  He did not take his medication today.  He will continue his current medication and check more consistently at home.   Orders Placed This Encounter  Procedures  . DG Chest 2 View    Standing Status:   Future    Number of Occurrences:   1    Standing Expiration Date:    06/10/2019    Order Specific Question:   Reason for Exam (SYMPTOM  OR DIAGNOSIS REQUIRED)    Answer:   intermittent exertional chest pain and dyspnea for past 6 months    Order Specific Question:   Preferred imaging location?    Answer:   [facility] Specific Question:   Radiology Contrast Protocol - do NOT remove file path    Answer:   [email or web address]  . Comp Met (CMET)  . HgB A1c  . PSA  . Direct LDL  . Pulmonary Function Test [facility] Only    Standing Status:   Future    Standing Expiration Date:   04/10/2019    Order Specific Question:   Full PFT: includes the following: basic spirometry, spirometry pre & post bronchodilator, diffusion capacity (DLCO), lung volumes    Answer:   Full PFT    Order Specific Question:   Methacholine challenge    Answer:   Yes    Order Specific Question:   This test can only be performed at    Answer:   [facility]    No orders of the defined types were placed in this encounter.    Tommi Rumps, MD [place]

## 2018-04-09 NOTE — Assessment & Plan Note (Signed)
Patient reports his blood pressure is always less than 130/80 when he checks it at home.  He did not take his medication today.  He will continue his current medication and check more consistently at home.

## 2018-04-09 NOTE — Assessment & Plan Note (Signed)
Symptoms with somewhat of a mixed picture concerning for cardiac cause though also some atypical components.  Symptoms do not consistently occur with fairly significant amounts of exertion though can occur with minimal exertion at times.  Reassuring that his stress echo was normal.  We will obtain a chest x-ray and PFTs.  We will have him see his cardiologist again to see if catheterization would be necessary.

## 2018-04-09 NOTE — Assessment & Plan Note (Signed)
Physical exam completed.  He will continue to stay active and monitor his diet.  He will check with his insurance regarding Shingrix coverage.  Lab work as outlined below.

## 2018-04-09 NOTE — Patient Instructions (Addendum)
Nice to see you. Please continue to stay active. Please monitor your diet as much as possible. We will get lab work today and contact you with the results. We will obtain a chest x-ray and lung function testing to evaluate your shortness of breath and chest pain.  We will have you follow-up with cardiology as well.

## 2018-04-10 ENCOUNTER — Other Ambulatory Visit: Payer: Self-pay | Admitting: Family Medicine

## 2018-04-10 ENCOUNTER — Telehealth: Payer: Self-pay

## 2018-04-10 DIAGNOSIS — E782 Mixed hyperlipidemia: Secondary | ICD-10-CM

## 2018-04-10 MED ORDER — ATORVASTATIN CALCIUM 80 MG PO TABS: 80.0000 mg | ORAL_TABLET | Freq: Every day | ORAL | 1 refills | Status: DC

## 2018-04-10 MED ORDER — METFORMIN HCL 1000 MG PO TABS: 1000.0000 mg | ORAL_TABLET | Freq: Two times a day (BID) | ORAL | 1 refills | Status: DC

## 2018-04-10 NOTE — Telephone Encounter (Signed)
-----   Message from Glori Luis, MD sent at 04/09/2018  5:21 PM EDT ----- Please let the patient know that his A1c is slightly worse than it was previously.  It is 7.6.  I would like to increase the dose of his metformin to 1000 mg twice daily.  His LDL is not quite at goal.  I would like to increase the dose of his Lipitor or switch him to Crestor to see if that would be more beneficial.  If he is willing to do either of those I can send to his pharmacy.  He will need a repeat direct LDL and hepatic function panel in 1 month.  Please place an order for those with the diagnosis of hyperlipidemia.  Thanks.

## 2018-04-12 ENCOUNTER — Ambulatory Visit: Payer: Managed Care, Other (non HMO)

## 2018-04-26 ENCOUNTER — Ambulatory Visit: Payer: Managed Care, Other (non HMO) | Attending: Family Medicine

## 2018-04-26 DIAGNOSIS — R0609 Other forms of dyspnea: Secondary | ICD-10-CM | POA: Diagnosis present

## 2018-04-26 DIAGNOSIS — R0600 Dyspnea, unspecified: Secondary | ICD-10-CM | POA: Diagnosis not present

## 2018-04-26 MED ORDER — METHACHOLINE 0.25 MG/ML NEB SOLN
2.0000 mL | Freq: Once | RESPIRATORY_TRACT | Status: AC
  Administered 2018-04-26: 0.5 mg via RESPIRATORY_TRACT
  Filled 2018-04-26: qty 2

## 2018-04-26 MED ORDER — METHACHOLINE 0.0625 MG/ML NEB SOLN
2.0000 mL | Freq: Once | RESPIRATORY_TRACT | Status: AC
  Administered 2018-04-26: 0.125 mg via RESPIRATORY_TRACT
  Filled 2018-04-26: qty 2

## 2018-04-26 MED ORDER — METHACHOLINE 1 MG/ML NEB SOLN
2.0000 mL | Freq: Once | RESPIRATORY_TRACT | Status: AC
  Administered 2018-04-26: 2 mg via RESPIRATORY_TRACT
  Filled 2018-04-26: qty 2

## 2018-04-26 MED ORDER — METHACHOLINE 16 MG/ML NEB SOLN
2.0000 mL | Freq: Once | RESPIRATORY_TRACT | Status: AC
  Administered 2018-04-26: 32 mg via RESPIRATORY_TRACT
  Filled 2018-04-26: qty 2

## 2018-04-26 MED ORDER — METHACHOLINE 4 MG/ML NEB SOLN
2.0000 mL | Freq: Once | RESPIRATORY_TRACT | Status: AC
  Administered 2018-04-26: 8 mg via RESPIRATORY_TRACT
  Filled 2018-04-26: qty 2

## 2018-04-26 MED ORDER — ALBUTEROL SULFATE (2.5 MG/3ML) 0.083% IN NEBU
2.5000 mg | INHALATION_SOLUTION | Freq: Once | RESPIRATORY_TRACT | Status: AC
  Administered 2018-04-26: 2.5 mg via RESPIRATORY_TRACT
  Filled 2018-04-26: qty 3

## 2018-04-26 MED ORDER — SODIUM CHLORIDE 0.9 % IN NEBU
3.0000 mL | INHALATION_SOLUTION | Freq: Once | RESPIRATORY_TRACT | Status: AC
  Administered 2018-04-26: 3 mL via RESPIRATORY_TRACT

## 2018-05-03 ENCOUNTER — Encounter: Payer: Self-pay | Admitting: Family Medicine

## 2018-05-07 ENCOUNTER — Encounter: Payer: Self-pay | Admitting: Family Medicine

## 2018-05-14 ENCOUNTER — Telehealth: Payer: Self-pay | Admitting: Family Medicine

## 2018-05-14 ENCOUNTER — Other Ambulatory Visit (INDEPENDENT_AMBULATORY_CARE_PROVIDER_SITE_OTHER): Payer: Managed Care, Other (non HMO)

## 2018-05-14 DIAGNOSIS — E782 Mixed hyperlipidemia: Secondary | ICD-10-CM | POA: Diagnosis not present

## 2018-05-14 LAB — HEPATIC FUNCTION PANEL
ALK PHOS: 70 U/L (ref 39–117)
ALT: 23 U/L (ref 0–53)
AST: 19 U/L (ref 0–37)
Albumin: 4.6 g/dL (ref 3.5–5.2)
BILIRUBIN TOTAL: 0.5 mg/dL (ref 0.2–1.2)
Bilirubin, Direct: 0.1 mg/dL (ref 0.0–0.3)
Total Protein: 7.7 g/dL (ref 6.0–8.3)

## 2018-05-14 LAB — LDL CHOLESTEROL, DIRECT: Direct LDL: 80 mg/dL

## 2018-05-14 NOTE — Telephone Encounter (Signed)
Pt dropped off wellness form to be filled out placed in Dr. Kermit BaloSonnenbergs color folder upfront.. Pt needs form faxed to [phone] and would also like a copy when completed   Pt also would like a call back about his Lipitor.

## 2018-05-14 NOTE — Telephone Encounter (Signed)
Placed in red folder  

## 2018-05-16 ENCOUNTER — Ambulatory Visit: Payer: Managed Care, Other (non HMO) | Admitting: Urology

## 2018-05-17 ENCOUNTER — Encounter: Payer: Self-pay | Admitting: Family Medicine

## 2018-05-17 NOTE — Telephone Encounter (Signed)
Signed. Please make available to patient.

## 2018-05-18 NOTE — Progress Notes (Signed)
Patient has Nurse, learning disabilitycommercial insurance through [employer] from what I can tell so this should be easier to get. I started the PA for Repatha. I'll let you know if/when it's approved and once approved we can send the script to the pharmacy.   Allena Katzaroline E Rishaan Gunner, Pharm.D., BCPS PGY2 Ambulatory Care Pharmacy Resident Phone: [phone]

## 2018-05-18 NOTE — Telephone Encounter (Signed)
Form faxed. Patient notified.

## 2018-05-19 NOTE — Progress Notes (Signed)
Cardiology Office Note  Date:  05/21/2018   ID:  Caleb Warren, DOB 04/11/1961, MRN [id number]  PCP:  Caleb Warren, Caleb G, MD   Chief Complaint  Patient presents with  . other    Pt c/o chest pain and shortness of breath. Meds reviewed by the pt. verbally. Pt. had a pulmonary function test recently and was told to follow up with his cardiologist since hasn't been seen in a while.      HPI:  Caleb Warren is a 57 year old gentleman with past medical history of gerd DM, poorly controlled, hemoglobin A1c of 10 HTN Arthritis in his hands Kidney stone, requiring nephrostomy tubes and extraction Smoker, quit 28 yr ago, stopped 1990, smoked 5 years Obesity Presenting for follow-up of his  chest pressure and SOB.  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 active recently  Does lots of scuba diving  Previously took weight loss medication phentermine Weight down 8 pounds  CT ABD 02/2017 Images reviewed with him in detail showing mild abdominal aortic atherosclerosis extending into common iliac arteries No coronary calcification noted  EKG personally reviewed by myself on todays visit Shows normal sinus rhythm rate 65 bpm no significant ST or T wave changes  Mom dies 1429 MVA dather died, etiology unclear  PMH:   has a past medical history of Arthritis, Chicken pox, Diabetes (HCC), Difficult intubation, Diverticulitis, GERD (gastroesophageal reflux disease), History of kidney  stones, Hyperlipidemia, and Hypertension.  PSH:    Past Surgical History:  Procedure Laterality Date  . COLONOSCOPY  2016  . CYSTOSCOPY WITH STENT PLACEMENT Right 02/24/2017   Procedure: CYSTOSCOPY WITH STENT PLACEMENT;  Surgeon: Hildred LaserBrian James Budzyn, MD;  Location: [facility] ORS;  Service: Urology;  Laterality: Right;  . IR NEPHROSTOMY PLACEMENT RIGHT  02/23/2017  . KIDNEY STONE SURGERY  1994  . NEPHROLITHOTOMY Right 02/24/2017   Procedure: NEPHROLITHOTOMY PERCUTANEOUS;  Surgeon: Hildred LaserBrian James Budzyn, MD;  Location: [facility] ORS;  Service: Urology;  Laterality: Right;    Current Outpatient Medications  Medication Sig Dispense Refill  . amLODipine (NORVASC) 10 MG tablet Take 1 tablet (10 mg total) by mouth daily. 90 tablet 3  . aspirin 81 MG tablet Take 81 mg by mouth daily.    Marland Kitchen. atorvastatin (LIPITOR) 80 MG tablet Take 1 tablet (80 mg total) by mouth daily. 90 tablet 1  . ezetimibe (ZETIA) 10 MG tablet Take 1 tablet (10 mg total) by mouth daily. 90 tablet 3  . metFORMIN (GLUCOPHAGE) 1000 MG tablet Take 1 tablet (1,000 mg total) by mouth 2 (two) times daily with a meal. 180 tablet 1  . omeprazole (PRILOSEC OTC) 20 MG tablet Take 20 mg by mouth every 3 (three) days.     No current facility-administered medications for this visit.      Allergies:   Horseradish [cochlearia armoracia]   Social History:  The patient  reports that he quit smoking about 29 years ago. His smoking use included cigarettes. He has a 2.50 pack-year smoking  history. He has never used smokeless tobacco. He reports that he drinks alcohol. He reports that he does not use drugs.   Family History:   family history includes Arthritis in his maternal grandfather and maternal grandmother; Cancer in his maternal grandmother and paternal uncle; Hyperlipidemia in his father and mother; Hypertension in his father and mother.    Review of Systems: Review of Systems  Constitutional: Negative.   Respiratory: Positive for shortness of breath.    Cardiovascular: Positive for chest pain.  Gastrointestinal: Negative.   Musculoskeletal: Negative.   Neurological: Negative.   Psychiatric/Behavioral: Negative.   All other systems reviewed and are negative.    PHYSICAL EXAM: VS:  BP 110/80 (BP Location: Left Arm, Patient Position: Sitting, Cuff Size: Normal)   Pulse 65   Ht 5' 5.5" (1.664 m)   Wt 199 lb (90.3 kg)   BMI 32.61 kg/m  , BMI Body mass index is 32.61 kg/m. Constitutional:  oriented to person, place, and time. No distress.  HENT:  Head: Normocephalic and atraumatic.  Eyes:  no discharge. No scleral icterus.  Neck: Normal range of motion. Neck supple. No JVD present.  Cardiovascular: Normal rate, regular rhythm, normal heart sounds and intact distal pulses. Exam reveals no gallop and no friction rub. No edema No murmur heard. Pulmonary/Chest: Effort normal and breath sounds normal. No stridor. No respiratory distress.  no wheezes.  no rales.  no tenderness.  Abdominal: Soft.  no distension.  no tenderness.  Musculoskeletal: Normal range of motion.  no  tenderness or deformity.  Neurological:  normal muscle tone. Coordination normal. No atrophy Skin: Skin is warm and dry. No rash noted. not diaphoretic.  Psychiatric:  normal mood and affect. behavior is normal. Thought content normal.    Recent Labs: 04/09/2018: BUN 15; Creatinine, Ser 0.96; Potassium 4.5; Sodium 140 05/14/2018: ALT 23    Lipid Panel Lab Results  Component Value Date   CHOL 191 11/08/2017   HDL 47 11/08/2017   LDLCALC 106 (H) 11/08/2017   TRIG 188 (H) 11/08/2017      Wt Readings from Last 3 Encounters:  05/21/18 199 lb (90.3 kg)  04/09/18 201 lb 6.4 oz (91.4 kg)  10/26/17 200 lb (90.7 kg)     ASSESSMENT AND PLAN:  Shortness of breath -  Recent treadmill echocardiogram with no ischemia Still having periodic shortness of breath with chest tightness Atypical in nature Recommended CT coronary calcium scoring for risk  stratification  Chest pain, unspecified type /chest pressure Atypical as well as typical features CT coronary calcium score as above for risk stratification  Mixed hyperlipidemia Tolerating Zetia Having muscle ache especially in his hands with high-dose Lipitor Recommended he hold the Lipitor for 1 week and try half dose 40 mg daily Statin intolerance Crestor simvastatin  Essential hypertension Blood pressure is well controlled on today's visit. No changes made to the medications. stable  Aortic atherosclerosis mild distal aorta common iliac arteries atherosclerosis Stressed importance of aggressive diabetes control and LDL of 70 or less  Controlled type 2 diabetes mellitus with other circulatory complication, without long-term current use of insulin (HCC) We have encouraged continued exercise, careful diet management in an effort to lose weight.  Disposition:   F/U as needed   Total encounter time more than 25 minutes  Greater than 50% was spent in counseling and coordination of care with the patient    Orders Placed This Encounter  Procedures  . EKG 12-Lead     Signed, Dossie Arbour, M.D.,  Ph.D. 05/21/2018  [facility] [place], [place] [phone]

## 2018-05-21 ENCOUNTER — Ambulatory Visit: Payer: Managed Care, Other (non HMO) | Admitting: Cardiovascular Disease

## 2018-05-21 ENCOUNTER — Encounter: Payer: Self-pay | Admitting: Cardiovascular Disease

## 2018-05-21 VITALS — BP 110/80 | HR 65 | Ht 65.5 in | Wt 199.0 lb

## 2018-05-21 DIAGNOSIS — E1159 Type 2 diabetes mellitus with other circulatory complications: Secondary | ICD-10-CM

## 2018-05-21 DIAGNOSIS — I700 Atherosclerosis of aorta: Secondary | ICD-10-CM | POA: Diagnosis not present

## 2018-05-21 DIAGNOSIS — E782 Mixed hyperlipidemia: Secondary | ICD-10-CM

## 2018-05-21 DIAGNOSIS — R0609 Other forms of dyspnea: Secondary | ICD-10-CM

## 2018-05-21 DIAGNOSIS — I10 Essential (primary) hypertension: Secondary | ICD-10-CM

## 2018-05-21 DIAGNOSIS — R0789 Other chest pain: Secondary | ICD-10-CM

## 2018-05-21 NOTE — Patient Instructions (Addendum)
Medication Instructions:   No medication changes made  Labwork:  No new labs needed  Testing/Procedures:  We will order a CT coronary calcium score $150. For chest pain   Follow-Up: It was a pleasure seeing you in the office today. Please call us if you have new issues that need to be addressed before your next appt.  [phone]  Your physician wants you to follow-up in: 12 months as needed You will receive a reminder letter in the mail two months in advance. If you don't receive a letter, please call our office to schedule the follow-up appointment.  If you need a refill on your cardiac medications before your next appointment, please call your pharmacy.  For educational health videos Log in to : [email or web address] Or : [email or web address], password : [email or web address]

## 2018-05-22 ENCOUNTER — Telehealth: Payer: Self-pay | Admitting: Pharmacist

## 2018-05-22 NOTE — Telephone Encounter (Signed)
Started PA over the phone for Repatha 140 mg/mL q14 days. Reference number = [id number].

## 2018-05-30 ENCOUNTER — Telehealth: Payer: Self-pay | Admitting: *Deleted

## 2018-05-30 NOTE — Telephone Encounter (Signed)
Received approval for Repatha from patients insurance Request ID [id number]. Approval date 05/24/18-11/24/2018.

## 2018-06-01 NOTE — Telephone Encounter (Signed)
[facility] checking status of refill request. Please advise

## 2018-06-01 NOTE — Telephone Encounter (Signed)
Patient approved for Repatha OK to enter script?

## 2018-06-03 NOTE — Telephone Encounter (Signed)
What pharmacy is it going to? [facility]? I'll send it once this is confirmed.

## 2018-06-04 MED ORDER — EVOLOCUMAB 140 MG/ML ~~LOC~~ SOSY: 140.0000 mg | PREFILLED_SYRINGE | SUBCUTANEOUS | 3 refills | Status: DC

## 2018-06-04 NOTE — Telephone Encounter (Signed)
Sent to pharmacy 

## 2018-06-04 NOTE — Telephone Encounter (Signed)
[facility] I have added to pharmacy list.

## 2018-06-05 ENCOUNTER — Encounter: Payer: Self-pay | Admitting: Cardiovascular Disease

## 2018-06-05 ENCOUNTER — Ambulatory Visit (INDEPENDENT_AMBULATORY_CARE_PROVIDER_SITE_OTHER)
Admission: RE | Admit: 2018-06-05 | Discharge: 2018-06-05 | Disposition: A | Discharge status: Home / Self Care | Payer: Self-pay | Source: Ambulatory Visit | Attending: Cardiovascular Disease | Admitting: Cardiovascular Disease

## 2018-06-05 DIAGNOSIS — R0789 Other chest pain: Secondary | ICD-10-CM

## 2018-06-05 DIAGNOSIS — R0609 Other forms of dyspnea: Secondary | ICD-10-CM

## 2018-06-05 DIAGNOSIS — E782 Mixed hyperlipidemia: Secondary | ICD-10-CM

## 2018-06-06 ENCOUNTER — Encounter: Payer: Self-pay | Admitting: Family Medicine

## 2018-06-06 ENCOUNTER — Encounter: Payer: Self-pay | Admitting: Cardiovascular Disease

## 2018-06-11 MED ORDER — ALBUTEROL SULFATE HFA 108 (90 BASE) MCG/ACT IN AERS: 2.0000 | INHALATION_SPRAY | Freq: Four times a day (QID) | RESPIRATORY_TRACT | 0 refills | Status: DC | PRN

## 2018-07-08 ENCOUNTER — Other Ambulatory Visit: Payer: Self-pay | Admitting: Family Medicine

## 2018-07-10 NOTE — Telephone Encounter (Signed)
Last OV 04/09/18 last filled 06/11/18 1 0rf

## 2018-07-10 NOTE — Telephone Encounter (Signed)
Refill sent to pharmacy.  Please contact the patient to see if this medication has been beneficial for his breathing.  Thanks.

## 2018-07-11 NOTE — Telephone Encounter (Signed)
Patient has an OV tomorrow

## 2018-07-12 ENCOUNTER — Ambulatory Visit (INDEPENDENT_AMBULATORY_CARE_PROVIDER_SITE_OTHER): Payer: Managed Care, Other (non HMO) | Admitting: Family Medicine

## 2018-07-12 ENCOUNTER — Encounter: Payer: Self-pay | Admitting: Family Medicine

## 2018-07-12 VITALS — BP 112/80 | HR 75 | Temp 97.6°F | Ht 66.0 in | Wt 200.4 lb

## 2018-07-12 DIAGNOSIS — J4520 Mild intermittent asthma, uncomplicated: Secondary | ICD-10-CM

## 2018-07-12 DIAGNOSIS — M79641 Pain in right hand: Secondary | ICD-10-CM | POA: Diagnosis not present

## 2018-07-12 DIAGNOSIS — I10 Essential (primary) hypertension: Secondary | ICD-10-CM | POA: Diagnosis not present

## 2018-07-12 DIAGNOSIS — M79642 Pain in left hand: Secondary | ICD-10-CM

## 2018-07-12 DIAGNOSIS — E782 Mixed hyperlipidemia: Secondary | ICD-10-CM

## 2018-07-12 DIAGNOSIS — E1159 Type 2 diabetes mellitus with other circulatory complications: Secondary | ICD-10-CM

## 2018-07-12 LAB — COMPREHENSIVE METABOLIC PANEL
ALBUMIN: 4.7 g/dL (ref 3.5–5.2)
ALK PHOS: 64 U/L (ref 39–117)
ALT: 25 U/L (ref 0–53)
AST: 19 U/L (ref 0–37)
BILIRUBIN TOTAL: 0.7 mg/dL (ref 0.2–1.2)
BUN: 16 mg/dL (ref 6–23)
CO2: 29 mEq/L (ref 19–32)
Calcium: 9.6 mg/dL (ref 8.4–10.5)
Chloride: 104 mEq/L (ref 96–112)
Creatinine, Ser: 0.98 mg/dL (ref 0.40–1.50)
GFR: 84.49 mL/min (ref >60.00)
GLUCOSE: 114 mg/dL — AB (ref 70–99)
Potassium: 3.9 mEq/L (ref 3.5–5.1)
Sodium: 141 mEq/L (ref 135–145)
TOTAL PROTEIN: 7.8 g/dL (ref 6.0–8.3)

## 2018-07-12 LAB — MICROALBUMIN / CREATININE URINE RATIO
CREATININE, U: 102.3 mg/dL
Microalb Creat Ratio: 1 mg/g (ref 0.0–30.0)
Microalb, Ur: 1 mg/dL (ref 0.0–1.9)

## 2018-07-12 LAB — HEMOGLOBIN A1C: Hgb A1c MFr Bld: 7.1 % — ABNORMAL HIGH (ref 4.6–6.5)

## 2018-07-12 LAB — LDL CHOLESTEROL, DIRECT: Direct LDL: 18 mg/dL

## 2018-07-12 NOTE — Assessment & Plan Note (Signed)
Check A1c.  Continue metformin. 

## 2018-07-12 NOTE — Assessment & Plan Note (Signed)
Well-controlled.  Continue current regimen. 

## 2018-07-12 NOTE — Patient Instructions (Signed)
Nice to see you. We will check lab work today and contact you with the results. 

## 2018-07-12 NOTE — Assessment & Plan Note (Signed)
Potentially osteoarthritis though could be related to his Lipitor.  Will consider decreasing the dose once his LDL returns.

## 2018-07-12 NOTE — Assessment & Plan Note (Signed)
Cardiac evaluation without cause for his prior dyspnea.  Suspect reactive airway disease related.  He is doing well currently with minimal albuterol use.  He will monitor.  Continue albuterol.  If use increases could consider controller medication.

## 2018-07-12 NOTE — Assessment & Plan Note (Signed)
Check LDL.  Consider discontinuing Zetia and decreasing Lipitor dose now that he is on Repatha.

## 2018-07-12 NOTE — Progress Notes (Signed)
  Tommi Rumps, MD Phone: [phone]  Caleb Warren is a 57 y.o. male who presents today for f/u.  CC: DM, HTN, HLD,   DIABETES Disease Monitoring: Blood Sugar ranges-not checking Polyuria/phagia/dipsia- no     Optho- UTD Medications: Compliance- taking metformin Hypoglycemic symptoms- no  HYPERTENSION  Disease Monitoring  Home BP Monitoring not checking Chest pain- no    Dyspnea- no Medications  Compliance-  Taking amlodipine.  Edema- no  HYPERLIPIDEMIA Symptoms Chest pain on exertion:  no    Medications: Compliance- taking zetia, lipitor, repatha Right upper quadrant pain- no  Muscle aches- no  He does note some arthralgias in his hands that might be a little worse since being on the Lipitor.  Patient has been evaluated for dyspnea by cardiology and evaluation was negative for cause.  He also had PFTs completed that revealed possible mild reactive airway disease.  He has only required the albuterol twice though it has helped significantly when he uses it.  No nighttime symptoms, cough, or wheezing.    Social History   Tobacco Use  Smoking Status Former Smoker  . Packs/day: 0.50  . Years: 5.00  . Pack years: 2.50  . Types: Cigarettes  . Last attempt to quit: 11/21/1988  . Years since quitting: 29.6  Smokeless Tobacco Never Used     ROS see history of present illness  Objective  Physical Exam Vitals:   07/12/18 1057  BP: 112/80  Pulse: 75  Temp: 97.6 F (36.4 C)  SpO2: 97%    BP Readings from Last 3 Encounters:  07/12/18 112/80  05/21/18 110/80  04/09/18 132/90   Wt Readings from Last 3 Encounters:  07/12/18 200 lb 6.4 oz (90.9 kg)  05/21/18 199 lb (90.3 kg)  04/09/18 201 lb 6.4 oz (91.4 kg)    Physical Exam  Constitutional: No distress.  Cardiovascular: Normal rate, regular rhythm and normal heart sounds.  Pulmonary/Chest: Effort normal and breath sounds normal.  Musculoskeletal: He exhibits no edema.  Neurological: He is alert.  Skin:  Skin is warm and dry. He is not diaphoretic.     Assessment/Plan: Please see individual problem list.  Hypertension Well-controlled.  Continue current regimen.  Diabetes mellitus type 2, controlled ([place]) Check A1c.  Continue metformin.  Hyperlipidemia Check LDL.  Consider discontinuing Zetia and decreasing Lipitor dose now that he is on Repatha.  Bilateral hand pain Potentially osteoarthritis though could be related to his Lipitor.  Will consider decreasing the dose once his LDL returns.  Reactive airway disease Cardiac evaluation without cause for his prior dyspnea.  Suspect reactive airway disease related.  He is doing well currently with minimal albuterol use.  He will monitor.  Continue albuterol.  If use increases could consider controller medication.   Orders Placed This Encounter  Procedures  . HgB A1c  . Comp Met (CMET)  . Direct LDL  . Urine Microalbumin w/creat. ratio    No orders of the defined types were placed in this encounter.    Tommi Rumps, MD [place]

## 2018-07-13 ENCOUNTER — Encounter: Payer: Self-pay | Admitting: Family Medicine

## 2018-07-13 ENCOUNTER — Other Ambulatory Visit: Payer: Self-pay | Admitting: Family Medicine

## 2018-07-13 ENCOUNTER — Ambulatory Visit: Payer: Self-pay | Admitting: Family Medicine

## 2018-07-13 DIAGNOSIS — E785 Hyperlipidemia, unspecified: Secondary | ICD-10-CM

## 2018-07-13 MED ORDER — ATORVASTATIN CALCIUM 40 MG PO TABS: 40.0000 mg | ORAL_TABLET | Freq: Every day | ORAL | 1 refills | Status: DC

## 2018-07-16 MED ORDER — EMPAGLIFLOZIN 10 MG PO TABS: 10.0000 mg | ORAL_TABLET | Freq: Every day | ORAL | 3 refills | Status: DC

## 2018-08-05 ENCOUNTER — Other Ambulatory Visit: Payer: Self-pay | Admitting: Family Medicine

## 2018-08-13 ENCOUNTER — Other Ambulatory Visit (INDEPENDENT_AMBULATORY_CARE_PROVIDER_SITE_OTHER): Payer: Managed Care, Other (non HMO)

## 2018-08-13 DIAGNOSIS — E785 Hyperlipidemia, unspecified: Secondary | ICD-10-CM

## 2018-08-13 LAB — LDL CHOLESTEROL, DIRECT: LDL DIRECT: 19 mg/dL

## 2018-08-14 ENCOUNTER — Other Ambulatory Visit: Payer: Self-pay

## 2018-08-14 MED ORDER — ATORVASTATIN CALCIUM 20 MG PO TABS: 20.0000 mg | ORAL_TABLET | Freq: Every day | ORAL | 0 refills | Status: DC

## 2018-09-19 ENCOUNTER — Other Ambulatory Visit: Payer: Self-pay | Admitting: Family Medicine

## 2018-09-19 MED ORDER — ATORVASTATIN CALCIUM 20 MG PO TABS: 20.0000 mg | ORAL_TABLET | Freq: Every day | ORAL | 0 refills | Status: DC

## 2018-10-03 ENCOUNTER — Other Ambulatory Visit: Payer: Self-pay | Admitting: Family Medicine

## 2018-10-05 ENCOUNTER — Other Ambulatory Visit: Payer: Self-pay | Admitting: Family Medicine

## 2018-10-09 ENCOUNTER — Telehealth: Payer: Self-pay | Admitting: Family Medicine

## 2018-10-09 NOTE — Telephone Encounter (Signed)
PA has already been done called patient's insurance.

## 2018-10-09 NOTE — Telephone Encounter (Signed)
Copied from CRM [id number]. Topic: General - Other >> Oct 09, 2018  1:30 PM Leafy Roobinson, Norma J wrote: Reason for CRM: [facility] is calling the repatha 140 mg needs a PA [phone]

## 2018-10-09 NOTE — Telephone Encounter (Signed)
Please advise. If you need me to do PA let me know.

## 2018-10-11 ENCOUNTER — Other Ambulatory Visit: Payer: Self-pay | Admitting: Family Medicine

## 2018-10-17 ENCOUNTER — Encounter: Payer: Self-pay | Admitting: Family Medicine

## 2018-10-17 ENCOUNTER — Ambulatory Visit: Payer: Managed Care, Other (non HMO) | Admitting: Family Medicine

## 2018-10-17 VITALS — BP 122/84 | HR 93 | Temp 98.1°F | Wt 202.4 lb

## 2018-10-17 DIAGNOSIS — J4520 Mild intermittent asthma, uncomplicated: Secondary | ICD-10-CM

## 2018-10-17 DIAGNOSIS — E1159 Type 2 diabetes mellitus with other circulatory complications: Secondary | ICD-10-CM | POA: Diagnosis not present

## 2018-10-17 DIAGNOSIS — E782 Mixed hyperlipidemia: Secondary | ICD-10-CM | POA: Diagnosis not present

## 2018-10-17 DIAGNOSIS — I10 Essential (primary) hypertension: Secondary | ICD-10-CM

## 2018-10-17 LAB — HEMOGLOBIN A1C: HEMOGLOBIN A1C: 6.6 % — AB (ref 4.6–6.5)

## 2018-10-17 LAB — LDL CHOLESTEROL, DIRECT: LDL DIRECT: 36 mg/dL

## 2018-10-17 NOTE — Assessment & Plan Note (Signed)
Asymptomatic.  Continue as needed albuterol.

## 2018-10-17 NOTE — Progress Notes (Signed)
  Marikay AlarEric Kla Bily, MD Phone: [phone]  Caleb Warren R Gambrill is a 57 y.o. male who presents today for f/u.  CC: dm, hld, htn, RAD  HYPERTENSION Disease Monitoring: Blood pressure range-not checking chest pain-no      dyspnea-no Medications: Compliance-taking amlodipine   Edema-no  DIABETES Disease Monitoring: Blood Sugar ranges-not checking polyuria/phagia/dipsia-no      Optho-due in December Medications: Compliance-taking metformin, Jardiance hypoglycemic symptoms-no  HYPERLIPIDEMIA Disease Monitoring: See symptoms for Hypertension Medications: Compliance-taking Lipitor, Repatha right upper quadrant pain-no muscle aches-no  Reactive airway disease: Patient has not had uses albuterol anytime recently.  No coughing or wheezing.     Social History   Tobacco Use  Smoking Status Former Smoker  . Packs/day: 0.50  . Years: 5.00  . Pack years: 2.50  . Types: Cigarettes  . Last attempt to quit: 11/21/1988  . Years since quitting: 29.9  Smokeless Tobacco Never Used     ROS see history of present illness  Objective  Physical Exam Vitals:   10/17/18 1350  BP: 122/84  Pulse: 93  Temp: 98.1 F (36.7 C)  SpO2: 95%    BP Readings from Last 3 Encounters:  10/17/18 122/84  07/12/18 112/80  05/21/18 110/80   Wt Readings from Last 3 Encounters:  10/17/18 202 lb 6.4 oz (91.8 kg)  07/12/18 200 lb 6.4 oz (90.9 kg)  05/21/18 199 lb (90.3 kg)    Physical Exam  Constitutional: No distress.  Cardiovascular: Normal rate, regular rhythm and normal heart sounds.  Pulmonary/Chest: Effort normal and breath sounds normal.  Musculoskeletal: He exhibits no edema.  Neurological: He is alert.  Skin: Skin is warm and dry. He is not diaphoretic.     Assessment/Plan: Please see individual problem list.  Hypertension Well-controlled.  Continue current regimen.  Diabetes mellitus type 2, controlled ([facility]) Check A1c.  Continue current regimen.  Hyperlipidemia Check LDL.   Continue current regimen.  Reactive airway disease Asymptomatic.  Continue as needed albuterol.    Orders Placed This Encounter  Procedures  . Direct LDL  . HgB A1c    No orders of the defined types were placed in this encounter.    Marikay AlarEric Latonga Ponder, MD [facility]

## 2018-10-17 NOTE — Assessment & Plan Note (Signed)
Check LDL.  Continue current regimen. 

## 2018-10-17 NOTE — Patient Instructions (Signed)
Nice to see you. We will get lab work today and contact you with the results. 

## 2018-10-17 NOTE — Assessment & Plan Note (Signed)
Check A1c.  Continue current regimen. 

## 2018-10-17 NOTE — Assessment & Plan Note (Signed)
Well-controlled.  Continue current regimen. 

## 2018-11-11 ENCOUNTER — Other Ambulatory Visit: Payer: Self-pay | Admitting: Family Medicine

## 2018-11-12 MED ORDER — ATORVASTATIN CALCIUM 20 MG PO TABS: 20.0000 mg | ORAL_TABLET | Freq: Every day | ORAL | 0 refills | Status: DC

## 2018-11-12 MED ORDER — AMLODIPINE BESYLATE 10 MG PO TABS: 10.0000 mg | ORAL_TABLET | Freq: Every day | ORAL | 0 refills | Status: DC

## 2018-11-19 ENCOUNTER — Other Ambulatory Visit: Payer: Self-pay | Admitting: Family Medicine

## 2018-11-19 NOTE — Telephone Encounter (Signed)
Copied from CRM [phone]. Topic: Quick Communication - Rx Refill/Question >> Nov 19, 2018  1:50 PM Jilda Rocheemaray, Melissa wrote: Medication: empagliflozin (JARDIANCE) 10 MG TABS tablet Has the patient contacted their pharmacy? Yes.   (Agent: If no, request that the patient contact the pharmacy for the refill.) (Agent: If yes, when and what did the pharmacy advise?) call office for refill request  Preferred Pharmacy (with phone number or street name): [id number] [place],  - [address] [phone] (Phone) [phone] (Fax)    Agent: Please be advised that RX refills may take up to 3 business days. We ask that you follow-up with your pharmacy.

## 2018-11-20 MED ORDER — EMPAGLIFLOZIN 10 MG PO TABS: 10.0000 mg | ORAL_TABLET | Freq: Every day | ORAL | 1 refills | Status: DC

## 2018-11-26 ENCOUNTER — Other Ambulatory Visit: Payer: Self-pay | Admitting: Family Medicine

## 2018-11-26 NOTE — Telephone Encounter (Signed)
Copied from CRM [id number]. Topic: Quick Communication - Rx Refill/Question >> Nov 26, 2018  8:58 AM Gean Birchwood R wrote: Medication: Evolocumab (REPATHA) 140 MG/ML SOSY  Has the patient contacted their pharmacy?Yes , pts refill needs a PA  Preferred Pharmacy (with phone number or street name): [id number] [place], [place] - [address] AT [facility] [address] [phone] (Phone) [phone] (Fax)    Agent: Please be advised that RX refills may take up to 3 business days. We ask that you follow-up with your pharmacy.

## 2018-11-26 NOTE — Telephone Encounter (Signed)
PA needed for Evolocumab.   Last refill 06/04/18 #6/3 refills  Pharmacy: [id number] - [place], [place] - [address] AT [facility] OF [address] [phone] (Phone) [phone] (Fax)

## 2018-11-27 ENCOUNTER — Encounter: Payer: Self-pay | Admitting: Family Medicine

## 2018-11-30 NOTE — Telephone Encounter (Signed)
Patient needs PA for Repatha.

## 2018-11-30 NOTE — Telephone Encounter (Signed)
Pharmacy called for PA for Evolocumab (REPATHA) 140 MG/ML SOSY Spoke with Caleb Warren and she advised she'll be taking care of it today.

## 2018-12-04 NOTE — Telephone Encounter (Signed)
Casimer Bilis with [facility] called to say that the renewal Prior authorization form have been faxed to the office asking for it to be completed and returned to [facility] so patient can get his Evolocumab (REPATHA) 140 MG/ML SOSY. Any questions please call Ph# [phone]

## 2018-12-06 NOTE — Telephone Encounter (Signed)
Sent to kathy  

## 2018-12-07 ENCOUNTER — Telehealth: Payer: Self-pay | Admitting: *Deleted

## 2018-12-07 NOTE — Telephone Encounter (Signed)
Copied from CRM #[id number]. Topic: General - Other >> Dec 07, 2018  9:57 AM Jaquita Rectoravis, Karen A wrote: Reason for CRM: Shanita with [facility] pharmacy called to get status on the PA for Evolocumab (REPATHA) 140 MG/ML SOSY. Call back # [phone]

## 2018-12-10 NOTE — Telephone Encounter (Signed)
Caleb Warren were you able to address the issue with this PA?

## 2018-12-10 NOTE — Telephone Encounter (Signed)
This PA has been re-submitted on Friday will be few days before we will hear anything sent patient message Friday.

## 2018-12-11 ENCOUNTER — Other Ambulatory Visit: Payer: Self-pay | Admitting: Family Medicine

## 2018-12-11 NOTE — Telephone Encounter (Signed)
Called pharmacy to advise that referral was re submitted. Pharmacist voiced understanding.

## 2018-12-14 NOTE — Telephone Encounter (Signed)
° ° °  [facility] call to say they spoke to pt insurance company and was told that the PA was denied on 12/12/2018  and they are asking if Dr Birdie Sons is going to appeal   Would like a call back   [phone] reference number  [phone]

## 2018-12-24 ENCOUNTER — Telehealth: Payer: Self-pay | Admitting: Family Medicine

## 2018-12-24 NOTE — Telephone Encounter (Signed)
Copied from CRM [id number]. Topic: Quick Communication - See Telephone Encounter >> Dec 24, 2018  8:45 AM Trula Slade wrote: CRM for notification. See Telephone encounter for: 12/24/18. Pam w/[facility] [phone], prescription number [phone] needs a call back about the patient's Evolocumab (REPATHA) 140 MG/ML SOSY medication because a prior authorization is needed and she needs to explain something that she said is very complicated.

## 2018-12-28 NOTE — Telephone Encounter (Signed)
Ruppa with [facility] states they need to know what to do about this refill request. They need a new prior auth. If you want to cancel this request, please call back and let them know something.

## 2018-12-31 NOTE — Telephone Encounter (Signed)
Caleb Warren Key: [id number] - PA Case ID: [id number]  refiled PA 72-120 hours for determination.

## 2018-12-31 NOTE — Telephone Encounter (Signed)
Olegario Messier do you know anything about this PA?

## 2019-01-03 ENCOUNTER — Telehealth: Payer: Self-pay

## 2019-01-03 NOTE — Telephone Encounter (Signed)
Vivi Ferns (KeyNormajean Baxter)  Rx #: [id number]  Repatha 140MG /ML syringes  Form Passenger transport manager PA Form    PA has been done waiting on determination

## 2019-01-10 NOTE — Telephone Encounter (Signed)
Patient received an approval letter from [facility], dated 12/31/18 and it said the medication is approved for 12/31/18 to 12/31/19, but the pharmacist said the prescription they have on hand is different than what was approved.  They need a prescription called in that says REPATHA 140 MG/ML SURE CLICK.

## 2019-01-11 NOTE — Telephone Encounter (Signed)
Called insurance Rx needed to go to [employer] took care of this Rx has been sent to [facility] and Rx has already been picked up per insurance. Called pt to advise and to confirm Rx was picked up but mailbox was full unabl eto leave a VM.

## 2019-01-11 NOTE — Telephone Encounter (Signed)
Caleb Warren (Key: [id number])  Rx #: [id number]  Repatha 140MG /ML syringes    PA was approved

## 2019-01-14 LAB — HM DIABETES EYE EXAM

## 2019-01-15 ENCOUNTER — Encounter: Payer: Self-pay | Admitting: Family Medicine

## 2019-02-10 ENCOUNTER — Other Ambulatory Visit: Payer: Self-pay | Admitting: Family Medicine

## 2019-04-24 ENCOUNTER — Ambulatory Visit: Payer: Self-pay | Admitting: Family Medicine

## 2019-05-07 ENCOUNTER — Other Ambulatory Visit: Payer: Self-pay | Admitting: Family Medicine

## 2019-05-22 ENCOUNTER — Other Ambulatory Visit: Payer: Self-pay | Admitting: Family Medicine

## 2019-05-27 ENCOUNTER — Other Ambulatory Visit: Payer: Self-pay | Admitting: Family Medicine

## 2019-06-07 ENCOUNTER — Other Ambulatory Visit: Payer: Self-pay | Admitting: Family Medicine

## 2019-06-27 ENCOUNTER — Other Ambulatory Visit: Payer: Self-pay

## 2019-07-02 ENCOUNTER — Other Ambulatory Visit: Payer: Self-pay

## 2019-07-02 ENCOUNTER — Ambulatory Visit (INDEPENDENT_AMBULATORY_CARE_PROVIDER_SITE_OTHER): Payer: Managed Care, Other (non HMO)

## 2019-07-02 ENCOUNTER — Ambulatory Visit (INDEPENDENT_AMBULATORY_CARE_PROVIDER_SITE_OTHER): Payer: Managed Care, Other (non HMO) | Admitting: Family Medicine

## 2019-07-02 ENCOUNTER — Encounter: Payer: Self-pay | Admitting: Family Medicine

## 2019-07-02 VITALS — BP 110/80 | HR 68 | Temp 97.7°F | Ht 66.0 in | Wt 189.6 lb

## 2019-07-02 DIAGNOSIS — M25511 Pain in right shoulder: Secondary | ICD-10-CM

## 2019-07-02 DIAGNOSIS — M79642 Pain in left hand: Secondary | ICD-10-CM

## 2019-07-02 DIAGNOSIS — Z0001 Encounter for general adult medical examination with abnormal findings: Secondary | ICD-10-CM

## 2019-07-02 DIAGNOSIS — Z125 Encounter for screening for malignant neoplasm of prostate: Secondary | ICD-10-CM

## 2019-07-02 DIAGNOSIS — H9313 Tinnitus, bilateral: Secondary | ICD-10-CM

## 2019-07-02 DIAGNOSIS — H9319 Tinnitus, unspecified ear: Secondary | ICD-10-CM | POA: Insufficient documentation

## 2019-07-02 DIAGNOSIS — Z1329 Encounter for screening for other suspected endocrine disorder: Secondary | ICD-10-CM

## 2019-07-02 DIAGNOSIS — Z130 Encounter for screening for diseases of the blood and blood-forming organs and certain disorders involving the immune mechanism: Secondary | ICD-10-CM | POA: Diagnosis not present

## 2019-07-02 DIAGNOSIS — E782 Mixed hyperlipidemia: Secondary | ICD-10-CM

## 2019-07-02 DIAGNOSIS — E1159 Type 2 diabetes mellitus with other circulatory complications: Secondary | ICD-10-CM

## 2019-07-02 DIAGNOSIS — M79641 Pain in right hand: Secondary | ICD-10-CM

## 2019-07-02 LAB — LIPID PANEL
Cholesterol: 80 mg/dL (ref 0–200)
HDL: 50 mg/dL (ref >39.00)
LDL Cholesterol: 9 mg/dL (ref 0–99)
NonHDL: 29.83
Total CHOL/HDL Ratio: 2
Triglycerides: 106 mg/dL (ref 0.0–149.0)
VLDL: 21.2 mg/dL (ref 0.0–40.0)

## 2019-07-02 LAB — COMPREHENSIVE METABOLIC PANEL
ALT: 24 U/L (ref 0–53)
AST: 20 U/L (ref 0–37)
Albumin: 4.8 g/dL (ref 3.5–5.2)
Alkaline Phosphatase: 71 U/L (ref 39–117)
BUN: 12 mg/dL (ref 6–23)
CO2: 26 mEq/L (ref 19–32)
Calcium: 9.7 mg/dL (ref 8.4–10.5)
Chloride: 104 mEq/L (ref 96–112)
Creatinine, Ser: 0.97 mg/dL (ref 0.40–1.50)
GFR: 80.15 mL/min (ref >60.00)
Glucose, Bld: 88 mg/dL (ref 70–99)
Potassium: 4.5 mEq/L (ref 3.5–5.1)
Sodium: 139 mEq/L (ref 135–145)
Total Bilirubin: 0.8 mg/dL (ref 0.2–1.2)
Total Protein: 7.2 g/dL (ref 6.0–8.3)

## 2019-07-02 LAB — PSA: PSA: 0.27 ng/mL (ref 0.10–4.00)

## 2019-07-02 LAB — CBC
HCT: 48.1 % (ref 39.0–52.0)
Hemoglobin: 15.9 g/dL (ref 13.0–17.0)
MCHC: 33 g/dL (ref 30.0–36.0)
MCV: 90.7 fl (ref 78.0–100.0)
Platelets: 214 10*3/uL (ref 150.0–400.0)
RBC: 5.3 Mil/uL (ref 4.22–5.81)
RDW: 14.1 % (ref 11.5–15.5)
WBC: 8.2 10*3/uL (ref 4.0–10.5)

## 2019-07-02 LAB — MICROALBUMIN / CREATININE URINE RATIO
Creatinine,U: 72.1 mg/dL
Microalb Creat Ratio: 1 mg/g (ref 0.0–30.0)
Microalb, Ur: 0.7 mg/dL (ref 0.0–1.9)

## 2019-07-02 LAB — TSH: TSH: 1.43 u[IU]/mL (ref 0.35–4.50)

## 2019-07-02 LAB — HEMOGLOBIN A1C: Hgb A1c MFr Bld: 6.9 % — ABNORMAL HIGH (ref 4.6–6.5)

## 2019-07-02 NOTE — Assessment & Plan Note (Signed)
Status post fall.  I suspect this is related to a muscular strain though given his discomfort we will obtain an x-ray.  Discussed limiting lifting with this arm.  Discussed keeping it mobile to prevent frozen shoulder.  He will continue ibuprofen over-the-counter.  He can ice it.

## 2019-07-02 NOTE — Patient Instructions (Addendum)
Nice to see you.  We will get an x-ray and labs today.  You can ice your shoulder. Please do not lift anything with your shoulder >10 lbs or that causes pain. You can take ibuprofen over the counter for your shoulder.  Please stop your aspirin for 7 days and see if that helps with your ringing in your ears.

## 2019-07-02 NOTE — Progress Notes (Signed)
Tommi Rumps, MD Phone: [phone]  Caleb Warren is a 58 y.o. male who presents today for CPE.  Diet: This is healthy.  No fast food.  Not many starches.  Occasional soda. Exercise: He does some though not as much as he was previously.  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 Prostate cancer screening due.  No family history of prostate cancer. Colon cancer screening up-to-date 07/24/2014 with 10-year recall he had no polyps. Tetanus vaccine and pneumonia vaccine up-to-date.  Due for Shingrix though he questions whether or not his insurance will cover this. Hepatitis C and HIV screening up-to-date. Tobacco use in the past.  Quit in 1990 and smoked about half a pack per day for 5 years.  Has 2-3 alcoholic beverages every few weeks.  No illicit drug use. Sees a dentist and an ophthalmologist. He does have ongoing arthralgias in his hands which have improved quite a bit with decreasing the Lipitor dose.  He has seen pain management for those.  Tinnitus: Patient notes this is been going on for about a year now.  It is a little more prominent.  It is in both ears.  It is a high-pitched ringing.  Oftentimes he does not notice it.  He did have some loud noise exposure when he was in the [employer].  No hearing loss.  No vertigo.  Right shoulder pain: Patient tripped on a drop cord at home and caught himself with his right arm.  Since then he has had right shoulder pain which is quite painful with abduction and external rotation.  He notes no popping sensation.  Ibuprofen has been mildly beneficial.  Active Ambulatory Problems    Diagnosis Date Noted  . Diabetes mellitus type 2, controlled ([place]) 05/25/2012  . Hypertension 05/25/2012  . Hyperlipidemia 05/25/2012  . Low testosterone 07/12/2012  . 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 Obesity (BMI 30-39.9) 05/20/2014  . Lipid screening 05/20/2014  . Encounter for general adult medical examination with abnormal findings  07/21/2016  . Left hand pain 12/14/2016  . Sesamoiditis (Left) 12/14/2016  . Arthritis associated with diabetes ([place]) 12/15/2016  . Bilateral hand pain 12/15/2016  . Nephrolithiasis 02/24/2017  . Chronic thumb pain (Left) 06/26/2017  . Localized primary osteoarthritis of carpometacarpal joint of left thumb 06/27/2017  . Arthritis of carpometacarpal [facility]) joint of left thumb 06/27/2017  . Arthritis of carpometacarpal joint 06/27/2017  . Reactive airway disease 10/20/2017  . Chest pressure 10/20/2017  . Aortic atherosclerosis ([place]) 10/20/2017  . Acute pain of right shoulder 07/02/2019  . Tinnitus 07/02/2019   Resolved Ambulatory Problems    Diagnosis Date Noted  . Fatigue 05/25/2012  . Obesity (BMI 30-39.9) 05/25/2012  . Skin lesion of scalp 07/12/2012  . Other malaise and fatigue 03/07/2013  . Decreased libido 07/24/2013  . Overweight (BMI 25.0-29.9) 12/12/2013  . Acute nonsuppurative otitis media of right ear 05/20/2014   Past Medical History:  Diagnosis Date  . Arthritis   . Chicken pox   . Diabetes ([place])   . Difficult intubation   . Diverticulitis   . GERD (gastroesophageal reflux disease)   . History of kidney stones     Family History  Problem Relation Age of Onset  . Hyperlipidemia Mother   . Hypertension Mother   . Hyperlipidemia Father   . Hypertension Father   . Arthritis Maternal Grandmother   . Cancer Maternal Grandmother        breast  . Arthritis Maternal  Grandfather   . Cancer Paternal Uncle        colon  . Prostate cancer Neg Hx   . Kidney cancer Neg Hx   . Bladder Cancer Neg Hx     Social History   Socioeconomic History  . Marital status: Married    Spouse name: Not on file  . Number of children: Not on file  . Years of education: Not on file  . Highest education level: Not on file  Occupational History  . Not on file  Social Needs  . Financial resource strain: Not on file  . Food insecurity    Worry: Not on file    Inability: Not on  file  . Transportation needs    Medical: Not on file    Non-medical: Not on file  Tobacco Use  . Smoking status: Former Smoker    Packs/day: 0.50    Years: 5.00    Pack years: 2.50    Types: Cigarettes    Quit date: 11/21/1988    Years since quitting: 30.6  . Smokeless tobacco: Never Used  Substance and Sexual Activity  . Alcohol use: Yes    Comment: occ  . Drug use: No  . Sexual activity: Not on file  Lifestyle  . Physical activity    Days per week: Not on file    Minutes per session: Not on file  . Stress: Not on file  Relationships  . Social Herbalist on phone: Not on file    Gets together: Not on file    Attends religious service: Not on file    Active member of club or organization: Not on file    Attends meetings of clubs or organizations: Not on file    Relationship status: Not on file  . Intimate partner violence    Fear of current or ex partner: Not on file    Emotionally abused: Not on file    Physically abused: Not on file    Forced sexual activity: Not on file  Other Topics Concern  . Not on file  Social History Narrative   Lives in [place]. No pets. Works for [employer].      Diet - regular diet, limited red meat   Exercise - walks    ROS  General:  Negative for nexplained weight loss, fever Skin: Negative for new or changing mole, sore that won't heal HEENT: Positive for tinnitus, negative for trouble hearing, trouble seeing, mouth sores, hoarseness, change in voice, dysphagia. CV:  Negative for chest pain, dyspnea, edema, palpitations Resp: Negative for cough, dyspnea, hemoptysis GI: Negative for nausea, vomiting, diarrhea, constipation, abdominal pain, melena, hematochezia. GU: Negative for dysuria, incontinence, urinary hesitance, hematuria, vaginal or penile discharge, polyuria, sexual difficulty, lumps in testicle or breasts MSK: Positive for right shoulder pain, negative for muscle cramps or aches, other joint pain or  swelling Neuro: Negative for headaches, weakness, numbness, dizziness, passing out/fainting Psych: Negative for depression, anxiety, memory problems  Objective  Physical Exam Vitals:   07/02/19 1021  BP: 110/80  Pulse: 68  Temp: 97.7 F (36.5 C)  SpO2: 98%    BP Readings from Last 3 Encounters:  07/02/19 110/80  10/17/18 122/84  07/12/18 112/80   Wt Readings from Last 3 Encounters:  07/02/19 189 lb 9.6 oz (86 kg)  10/17/18 202 lb 6.4 oz (91.8 kg)  07/12/18 200 lb 6.4 oz (90.9 kg)    Physical Exam Constitutional:      General:  He is not in acute distress.    Appearance: He is not diaphoretic.  HENT:     Head: Normocephalic and atraumatic.     Right Ear: Tympanic membrane, ear canal and external ear normal.     Left Ear: Tympanic membrane, ear canal and external ear normal.     Ears:     Comments: Hearing intact to finger rub bilaterally ears Eyes:     Conjunctiva/sclera: Conjunctivae normal.     Pupils: Pupils are equal, round, and reactive to light.  Cardiovascular:     Rate and Rhythm: Normal rate and regular rhythm.     Heart sounds: Normal heart sounds.  Pulmonary:     Effort: Pulmonary effort is normal.     Breath sounds: Normal breath sounds.  Abdominal:     General: Bowel sounds are normal.     Palpations: Abdomen is soft.  Musculoskeletal:     Right lower leg: No edema.     Left lower leg: No edema.     Comments: Bilateral shoulders with full range of motion, right anterior shoulder with mild tenderness over anterior musculature, no apparent bony tenderness of the shoulder or clavicle on the right, no left shoulder tenderness, no left clavicle tenderness, there is pain on active and passive abduction and external rotation of the right shoulder, no pain on range of motion left shoulder, negative empty can bilaterally, positive speeds right shoulder, negative speeds left shoulder  Skin:    General: Skin is warm and dry.  Neurological:     Mental Status: He  is alert.  Psychiatric:        Mood and Affect: Mood normal.      Assessment/Plan:   Encounter for general adult medical examination with abnormal findings Physical exam completed.  Encouraged continued healthy diet and remaining active.  Discussed Shingrix vaccine though he will check with his insurance to ensure coverage of this.  Prostate cancer screening with PSA.  Lab work as outlined below.  He will get his flu shot through work and he will inform us when this is completed.  Acute pain of right shoulder Status post fall.  I suspect this is related to a muscular strain though given his discomfort we will obtain an x-ray.  Discussed limiting lifting with this arm.  Discussed keeping it mobile to prevent frozen shoulder.  He will continue ibuprofen over-the-counter.  He can ice it.    Tinnitus Bilateral with no apparent hearing loss.  Benign exam.  Could be related to aspirin therapy.  Could be related to noise exposure in the past.  We will have him stop his aspirin for 1 week and he will send [place] a MyChart message letting us know how the tinnitus is.  Consider ENT evaluation if not improving.  Bilateral hand pain Continues to be an issue though has improved with decreasing the Lipitor dose.  We will check his cholesterol and if it is adequately controlled on combination Lipitor and Repatha we could consider discontinuing Lipitor and rechecking cholesterol.   Orders Placed This Encounter  Procedures  . DG Shoulder Right    Standing Status:   Future    Number of Occurrences:   1    Standing Expiration Date:   08/31/2020    Order Specific Question:   Reason for Exam (SYMPTOM  OR DIAGNOSIS REQUIRED)    Answer:   right shoulder pain on abduction and external rotation after a fall where he caught himself with his right arm  Order Specific Question:   Preferred imaging location?    Answer:   [facility] Specific Question:   Radiology Contrast Protocol - do NOT  remove file path    Answer:   \\[email or web address]s.pdf  . HgB A1c  . TSH  . CBC  . PSA  . Lipid panel  . Urine Microalbumin w/creat. ratio  . Comp Met (CMET)    No orders of the defined types were placed in this encounter.    Tommi Rumps, MD [place]

## 2019-07-02 NOTE — Assessment & Plan Note (Signed)
Bilateral with no apparent hearing loss.  Benign exam.  Could be related to aspirin therapy.  Could be related to noise exposure in the past.  We will have him stop his aspirin for 1 week and he will send [place] a MyChart message letting us know how the tinnitus is.  Consider ENT evaluation if not improving.

## 2019-07-02 NOTE — Assessment & Plan Note (Signed)
Physical exam completed.  Encouraged continued healthy diet and remaining active.  Discussed Shingrix vaccine though he will check with his insurance to ensure coverage of this.  Prostate cancer screening with PSA.  Lab work as outlined below.  He will get his flu shot through work and he will inform us when this is completed.

## 2019-07-02 NOTE — Assessment & Plan Note (Signed)
Continues to be an issue though has improved with decreasing the Lipitor dose.  We will check his cholesterol and if it is adequately controlled on combination Lipitor and Repatha we could consider discontinuing Lipitor and rechecking cholesterol.

## 2019-07-04 ENCOUNTER — Other Ambulatory Visit: Payer: Self-pay | Admitting: Family Medicine

## 2019-07-04 DIAGNOSIS — E785 Hyperlipidemia, unspecified: Secondary | ICD-10-CM

## 2019-07-04 NOTE — Progress Notes (Signed)
l °

## 2019-07-11 ENCOUNTER — Encounter: Payer: Self-pay | Admitting: Family Medicine

## 2019-07-11 DIAGNOSIS — H9313 Tinnitus, bilateral: Secondary | ICD-10-CM

## 2019-07-12 ENCOUNTER — Encounter: Payer: Self-pay | Admitting: Lab

## 2019-07-28 ENCOUNTER — Other Ambulatory Visit: Payer: Self-pay | Admitting: Family Medicine

## 2019-08-01 ENCOUNTER — Other Ambulatory Visit: Payer: Self-pay | Admitting: Family Medicine

## 2019-08-01 NOTE — Telephone Encounter (Signed)
Rx refilled.

## 2019-08-04 ENCOUNTER — Other Ambulatory Visit: Payer: Self-pay | Admitting: Family Medicine

## 2019-08-04 ENCOUNTER — Encounter: Payer: Self-pay | Admitting: Family Medicine

## 2019-08-06 ENCOUNTER — Other Ambulatory Visit: Payer: Self-pay | Admitting: Family Medicine

## 2019-08-18 ENCOUNTER — Other Ambulatory Visit: Payer: Self-pay | Admitting: Family Medicine

## 2019-08-19 MED ORDER — REPATHA 140 MG/ML ~~LOC~~ SOSY: 140.0000 mg | PREFILLED_SYRINGE | SUBCUTANEOUS | 0 refills | Status: DC

## 2019-08-19 MED ORDER — AMLODIPINE BESYLATE 10 MG PO TABS: 10.0000 mg | ORAL_TABLET | Freq: Every day | ORAL | 0 refills | Status: DC

## 2019-08-21 ENCOUNTER — Telehealth: Payer: Self-pay | Admitting: *Deleted

## 2019-08-21 NOTE — Telephone Encounter (Signed)
Copied from [place] [id number]. Topic: General - Other >> Aug 21, 2019  8:18 AM Carolyn Stare wrote: Estill Bamberg with [facility] cal lto say the below requires a PA and sent the reqover on Monday   ref [phone]     phone number [phone]  Evolocumab (REPATHA) 140 MG/ML SOSY

## 2019-08-22 NOTE — Telephone Encounter (Signed)
Caleb Warren calling back regarding message below. She is requesting a call back.

## 2019-09-06 NOTE — Telephone Encounter (Signed)
I returned a call to [facility] and the reference number would not pull up anything for the patient. I spoke to someone else and I received the authorization code for the PA and I called [facility] to see about the medication and I was informed that the patient already picked up the medication and his approval code is [id number] and his approval is indefinitely.  Nina,cma

## 2019-09-19 ENCOUNTER — Other Ambulatory Visit: Payer: Self-pay | Admitting: Family Medicine

## 2019-09-20 MED ORDER — REPATHA 140 MG/ML ~~LOC~~ SOSY: 140.0000 mg | PREFILLED_SYRINGE | SUBCUTANEOUS | 0 refills | Status: DC

## 2019-11-01 ENCOUNTER — Telehealth: Payer: Self-pay | Admitting: Family Medicine

## 2019-11-01 NOTE — Telephone Encounter (Signed)
I called pt twice and sent a Mychart msg to call ofc.

## 2019-11-04 ENCOUNTER — Telehealth: Payer: Self-pay | Admitting: *Deleted

## 2019-11-04 ENCOUNTER — Encounter: Payer: Self-pay | Admitting: Family Medicine

## 2019-11-04 ENCOUNTER — Other Ambulatory Visit: Payer: Self-pay

## 2019-11-04 ENCOUNTER — Ambulatory Visit (INDEPENDENT_AMBULATORY_CARE_PROVIDER_SITE_OTHER): Payer: Managed Care, Other (non HMO) | Admitting: Family Medicine

## 2019-11-04 DIAGNOSIS — E1159 Type 2 diabetes mellitus with other circulatory complications: Secondary | ICD-10-CM

## 2019-11-04 DIAGNOSIS — I10 Essential (primary) hypertension: Secondary | ICD-10-CM | POA: Diagnosis not present

## 2019-11-04 DIAGNOSIS — I700 Atherosclerosis of aorta: Secondary | ICD-10-CM | POA: Diagnosis not present

## 2019-11-04 DIAGNOSIS — Z20828 Contact with and (suspected) exposure to other viral communicable diseases: Secondary | ICD-10-CM

## 2019-11-04 DIAGNOSIS — Z20822 Contact with and (suspected) exposure to covid-19: Secondary | ICD-10-CM | POA: Insufficient documentation

## 2019-11-04 DIAGNOSIS — E782 Mixed hyperlipidemia: Secondary | ICD-10-CM

## 2019-11-04 NOTE — Assessment & Plan Note (Signed)
Continue risk factor management. 

## 2019-11-04 NOTE — Assessment & Plan Note (Signed)
Previously well controlled.  Continue current regimen. 

## 2019-11-04 NOTE — Assessment & Plan Note (Signed)
Patient was exposed at work.  He is asymptomatic.  He is following his works protocol.  He notes the health department should be coming in to test them.  He will let us know if they end up testing him.  He will let us know if he develops any symptoms.

## 2019-11-04 NOTE — Assessment & Plan Note (Signed)
Continue Repatha.  Check lipid panel with labs.

## 2019-11-04 NOTE — Telephone Encounter (Signed)
I informed the patient that his appointment would be a phone call, patient understood.  Rease Bier,cma

## 2019-11-04 NOTE — Assessment & Plan Note (Signed)
Continue Jardiance and Metformin.  He is due for some lab work that we will have to wait until it has been at least a month since his Covid exposure.  He will let us know if the health department ends up testing him.  Orders for labs placed.

## 2019-11-04 NOTE — Telephone Encounter (Signed)
Copied from [place] [id number]. Topic: General - Inquiry >> Nov 01, 2019  5:14 PM Alease Frame wrote: Reason for CRM: patient returned call from office

## 2019-11-04 NOTE — Progress Notes (Signed)
Virtual Visit via telephone Note  This visit type was conducted due to national recommendations for restrictions regarding the COVID-19 pandemic (e.g. social distancing).  This format is felt to be most appropriate for this patient at this time.  All issues noted in this document were discussed and addressed.  No physical exam was performed (except for noted visual exam findings with Video Visits).   I connected with Caleb Warren today at  9:30 AM EST by telephone and verified that I am speaking with the correct person using two identifiers. Location patient: work Location provider: work Persons participating in the virtual visit: patient, provider  I discussed the limitations, risks, security and privacy concerns of performing an evaluation and management service by telephone and the availability of in person appointments. I also discussed with the patient that there may be a patient responsible charge related to this service. The patient expressed understanding and agreed to proceed.  Interactive audio and video telecommunications were attempted between this provider and patient, however failed, due to patient having technical difficulties OR patient did not have access to video capability.  We continued and completed visit with audio only.   Reason for visit: follow-up  HPI: HYPERTENSION Disease Monitoring: Blood pressure range-not checking, no symptoms Chest pain- no      Dyspnea- no Medications: Compliance- taking amlodipine Lightheadedness- no   Edema- no  DIABETES Disease Monitoring: Blood Sugar ranges-not in a couple years Polyuria/phagia/dipsia- rare polyuria and polydipsia, went away on its own  Optho- UTD Medications: Compliance- jardiance, metformin Hypoglycemic symptoms- rare if not eating appropriately  HYPERLIPIDEMIA Disease Monitoring: See symptoms for Hypertension Medications: Compliance- taking repatha Right upper quadrant pain- no  Muscle aches- no  COVID19  exposure: Patient works at the [employer].  He was exposed to his supervisor who tested positive.  Patient Warren out about this on 11/01/2019.  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  He has been asymptomatic.  He notes the health department should be coming in today or tomorrow to test them.  He had his mask on and they are trying to keep 6 feet of distance between them.    ROS: See pertinent positives and negatives per HPI.  Past Medical History:  Diagnosis Date  . Arthritis    HANDS  . Chicken pox   . Diabetes ([place])   . Difficult intubation   . Diverticulitis   . GERD (gastroesophageal reflux disease)   . History of kidney stones   . Hyperlipidemia   . Hypertension     Past Surgical History:  Procedure Laterality Date  . COLONOSCOPY  2016  . CYSTOSCOPY WITH STENT PLACEMENT Right 02/24/2017   Procedure: CYSTOSCOPY WITH STENT PLACEMENT;  Surgeon: Nickie Retort, MD;  Location: [facility] ORS;  Service: Urology;  Laterality: Right;  . IR NEPHROSTOMY PLACEMENT RIGHT  02/23/2017  . [place]  . NEPHROLITHOTOMY Right 02/24/2017   Procedure: NEPHROLITHOTOMY PERCUTANEOUS;  Surgeon: Nickie Retort, MD;  Location: [facility] ORS;  Service: Urology;  Laterality: Right;    Family History  Problem Relation Age of Onset  . Hyperlipidemia Mother   . Hypertension Mother   . Hyperlipidemia Father   . Hypertension Father   . Arthritis Maternal Grandmother   . Cancer Maternal Grandmother        breast  . Arthritis Maternal Grandfather   . Cancer Paternal Uncle        colon  .  Prostate cancer Neg Hx   . Kidney cancer Neg Hx   . Bladder Cancer Neg Hx     SOCIAL HX: Former smoker.   Current Outpatient Medications:  .  albuterol (PROVENTIL HFA;VENTOLIN HFA) 108 (90 Base) MCG/ACT inhaler, TAKE 2 PUFFS BY MOUTH EVERY 6 HOURS AS NEEDED FOR WHEEZE OR SHORTNESS OF BREATH, Disp: 18 Inhaler, Rfl: 0 .  amLODipine  (NORVASC) 10 MG tablet, TAKE 1 TABLET BY MOUTH EVERY DAY, Disp: 90 tablet, Rfl: 1 .  amLODipine (NORVASC) 10 MG tablet, Take 1 tablet (10 mg total) by mouth daily., Disp: 90 tablet, Rfl: 0 .  aspirin 81 MG tablet, Take 81 mg by mouth daily., Disp: , Rfl:  .  Evolocumab (REPATHA) 140 MG/ML SOSY, Inject 140 mg into the skin every 14 (fourteen) days. Inject 140 mg under the skin every two weeks., Disp: 4 mL, Rfl: 0 .  JARDIANCE 10 MG TABS tablet, TAKE 1 TABLET BY MOUTH EVERY DAY, Disp: 90 tablet, Rfl: 1 .  metFORMIN (GLUCOPHAGE) 1000 MG tablet, TAKE 1 TABLET (1,000 MG TOTAL) BY MOUTH 2 (TWO) TIMES DAILY WITH A MEAL., Disp: 180 tablet, Rfl: 1 .  omeprazole (PRILOSEC OTC) 20 MG tablet, Take 20 mg by mouth every 3 (three) days., Disp: , Rfl:  .  atorvastatin (LIPITOR) 20 MG tablet, TAKE 1 TABLET BY MOUTH EVERY DAY, Disp: 90 tablet, Rfl: 0  EXAM: This is a telehealth telephone visit notes no physical exam was completed.  ASSESSMENT AND PLAN:  Discussed the following assessment and plan:  Aortic atherosclerosis (HCC) Continue risk factor management.  Hypertension Previously well controlled.  Continue current regimen.  Diabetes mellitus type 2, controlled (HCC) Continue Jardiance and Metformin.  He is due for some lab work that we will have to wait until it has been at least a month since his Covid exposure.  He will let us know if the health department ends up testing him.  Orders for labs placed.  Hyperlipidemia Continue Repatha.  Check lipid panel with labs.  Close exposure to COVID-19 virus Patient was exposed at work.  He is asymptomatic.  He is following his works protocol.  He notes the health department should be coming in to test them.  He will let us know if they end up testing him.  He will let us know if he develops any symptoms.    I discussed the assessment and treatment plan with the patient. The patient was provided an opportunity to ask questions and all were answered. The  patient agreed with the plan and demonstrated an understanding of the instructions.   The patient was advised to call back or seek an in-person evaluation if the symptoms worsen or if the condition fails to improve as anticipated.  I provided 12 minutes of non-face-to-face time during this encounter.   Marikay Alar, MD

## 2019-11-15 ENCOUNTER — Other Ambulatory Visit: Payer: Self-pay | Admitting: Family Medicine

## 2019-11-17 ENCOUNTER — Other Ambulatory Visit: Payer: Self-pay | Admitting: Family Medicine

## 2019-11-18 ENCOUNTER — Other Ambulatory Visit: Payer: Self-pay | Admitting: Family Medicine

## 2019-12-05 ENCOUNTER — Other Ambulatory Visit: Payer: Self-pay

## 2019-12-06 ENCOUNTER — Other Ambulatory Visit (INDEPENDENT_AMBULATORY_CARE_PROVIDER_SITE_OTHER): Payer: Managed Care, Other (non HMO)

## 2019-12-06 DIAGNOSIS — E785 Hyperlipidemia, unspecified: Secondary | ICD-10-CM | POA: Diagnosis not present

## 2019-12-06 LAB — LIPID PANEL
Cholesterol: 124 mg/dL (ref 0–200)
HDL: 51.4 mg/dL (ref >39.00)
LDL Cholesterol: 40 mg/dL (ref 0–99)
NonHDL: 72.73
Total CHOL/HDL Ratio: 2
Triglycerides: 164 mg/dL — ABNORMAL HIGH (ref 0.0–149.0)
VLDL: 32.8 mg/dL (ref 0.0–40.0)

## 2019-12-31 ENCOUNTER — Telehealth: Payer: Self-pay | Admitting: Family Medicine

## 2019-12-31 NOTE — Telephone Encounter (Signed)
PA started for Repatha On Cover my meds.

## 2020-01-09 ENCOUNTER — Other Ambulatory Visit: Payer: Self-pay | Admitting: Family Medicine

## 2020-02-10 ENCOUNTER — Other Ambulatory Visit: Payer: Self-pay | Admitting: Family Medicine

## 2020-03-06 ENCOUNTER — Encounter: Payer: Self-pay | Admitting: Family Medicine

## 2020-03-06 ENCOUNTER — Other Ambulatory Visit: Payer: Self-pay

## 2020-03-06 ENCOUNTER — Ambulatory Visit: Payer: Managed Care, Other (non HMO) | Admitting: Family Medicine

## 2020-03-06 VITALS — BP 130/80 | HR 71 | Temp 97.5°F | Ht 66.0 in | Wt 194.8 lb

## 2020-03-06 DIAGNOSIS — E782 Mixed hyperlipidemia: Secondary | ICD-10-CM

## 2020-03-06 DIAGNOSIS — I700 Atherosclerosis of aorta: Secondary | ICD-10-CM

## 2020-03-06 DIAGNOSIS — I10 Essential (primary) hypertension: Secondary | ICD-10-CM | POA: Diagnosis not present

## 2020-03-06 DIAGNOSIS — J4520 Mild intermittent asthma, uncomplicated: Secondary | ICD-10-CM

## 2020-03-06 DIAGNOSIS — E1159 Type 2 diabetes mellitus with other circulatory complications: Secondary | ICD-10-CM | POA: Diagnosis not present

## 2020-03-06 LAB — BASIC METABOLIC PANEL
BUN: 14 mg/dL (ref 6–23)
CO2: 24 mEq/L (ref 19–32)
Calcium: 9.3 mg/dL (ref 8.4–10.5)
Chloride: 105 mEq/L (ref 96–112)
Creatinine, Ser: 0.98 mg/dL (ref 0.40–1.50)
GFR: 79.02 mL/min (ref >60.00)
Glucose, Bld: 102 mg/dL — ABNORMAL HIGH (ref 70–99)
Potassium: 4.1 mEq/L (ref 3.5–5.1)
Sodium: 140 mEq/L (ref 135–145)

## 2020-03-06 LAB — HEMOGLOBIN A1C: Hgb A1c MFr Bld: 6.8 % — ABNORMAL HIGH (ref 4.6–6.5)

## 2020-03-06 NOTE — Patient Instructions (Signed)
Nice to see you. We will check lab work today and contact you with the results. Please try to limit overeating.

## 2020-03-06 NOTE — Assessment & Plan Note (Signed)
Continue risk factor management. 

## 2020-03-06 NOTE — Assessment & Plan Note (Signed)
Adequate control.  Continue current regimen.  Check BMP. 

## 2020-03-06 NOTE — Assessment & Plan Note (Signed)
Well-controlled on labs.  Continue current medication.

## 2020-03-06 NOTE — Assessment & Plan Note (Signed)
No significant issues with this.  Does have some shortness of breath when he overeats.  Encouraged him not to overeat.

## 2020-03-06 NOTE — Progress Notes (Signed)
Marikay Alar, MD Phone: [phone]  Caleb Warren is a 59 y.o. male who presents today for f/u.  HYPERTENSION Disease Monitoring: Blood pressure range-not checking Chest pain- no      Dyspnea- see below Medications: Compliance- taking amlodipine    Edema- no  DIABETES Disease Monitoring: Blood Sugar ranges-127 Polyuria/phagia/dipsia- no      Optho- due soon Medications: Compliance- taking jardiance, metformin Hypoglycemic symptoms- no  HYPERLIPIDEMIA Disease Monitoring: See symptoms for Hypertension Medications: Compliance- taking repatha Right upper quadrant pain- no  Muscle aches- no Arthralgias improved with stopping lipitor  Shortness of breath: This is a chronic intermittent issue.  Has been evaluated by cardiology and had PFTs.  PFTs revealed borderline hyperactivity.  Cardiology evaluation was reassuring.  Patient typically notes this if he overeats.  No reflux issues.  No blood in stool.  No dysphagia.  EGD 20+ years ago in the [employer] for reflux.     Social History   Tobacco Use  Smoking Status Former Smoker  . Packs/day: 0.50  . Years: 5.00  . Pack years: 2.50  . Types: Cigarettes  . Quit date: 11/21/1988  . Years since quitting: 31.3  Smokeless Tobacco Never Used     ROS see history of present illness  Objective  Physical Exam Vitals:   03/06/20 0946  BP: 130/80  Pulse: 71  Temp: (!) 97.5 F (36.4 C)  SpO2: 98%    BP Readings from Last 3 Encounters:  03/06/20 130/80  07/02/19 110/80  10/17/18 122/84   Wt Readings from Last 3 Encounters:  03/06/20 194 lb 12.8 oz (88.4 kg)  11/04/19 185 lb (83.9 kg)  07/02/19 189 lb 9.6 oz (86 kg)    Physical Exam Constitutional:      General: He is not in acute distress.    Appearance: He is not diaphoretic.  Cardiovascular:     Rate and Rhythm: Normal rate and regular rhythm.     Heart sounds: Normal heart sounds.  Pulmonary:     Effort: Pulmonary effort is normal.     Breath sounds: Normal  breath sounds.  Musculoskeletal:     Right lower leg: No edema.     Left lower leg: No edema.  Skin:    General: Skin is warm and dry.  Neurological:     Mental Status: He is alert.    Diabetic Foot Exam - Simple   Simple Foot Form Diabetic Foot exam was performed with the following findings: Yes 03/06/2020 10:00 AM  Visual Inspection No deformities, no ulcerations, no other skin breakdown bilaterally: Yes Sensation Testing Intact to touch and monofilament testing bilaterally: Yes Pulse Check Posterior Tibialis and Dorsalis pulse intact bilaterally: Yes Comments      Assessment/Plan: Please see individual problem list.  Aortic atherosclerosis (HCC) Continue risk factor management.  Hypertension Adequate control.  Continue current regimen.  Check BMP.  Diabetes mellitus type 2, controlled (HCC) Seems to be well controlled.  Check A1c.  Continue current regimen.  Hyperlipidemia Well-controlled on labs.  Continue current medication.  Reactive airway disease No significant issues with this.  Does have some shortness of breath when he overeats.  Encouraged him not to overeat.   Orders Placed This Encounter  Procedures  . HgB A1c  . Basic Metabolic Panel (BMET)    No orders of the defined types were placed in this encounter.   This visit occurred during the SARS-CoV-2 public health emergency.  Safety protocols were in place, including screening questions prior to the visit,  additional usage of staff PPE, and extensive cleaning of exam room while observing appropriate contact time as indicated for disinfecting solutions.    Jalisia Puchalski, MD [facility] [place]  

## 2020-03-06 NOTE — Assessment & Plan Note (Signed)
Seems to be well controlled.  Check A1c.  Continue current regimen.

## 2020-03-10 ENCOUNTER — Other Ambulatory Visit: Payer: Self-pay | Admitting: Family Medicine

## 2020-05-02 ENCOUNTER — Other Ambulatory Visit: Payer: Self-pay | Admitting: Family Medicine

## 2020-05-09 ENCOUNTER — Other Ambulatory Visit: Payer: Self-pay | Admitting: Family Medicine

## 2020-05-20 ENCOUNTER — Other Ambulatory Visit: Payer: Self-pay | Admitting: Family Medicine

## 2020-07-06 ENCOUNTER — Other Ambulatory Visit: Payer: Self-pay

## 2020-07-06 ENCOUNTER — Ambulatory Visit (INDEPENDENT_AMBULATORY_CARE_PROVIDER_SITE_OTHER): Payer: Managed Care, Other (non HMO) | Admitting: Family Medicine

## 2020-07-06 ENCOUNTER — Encounter: Payer: Self-pay | Admitting: Family Medicine

## 2020-07-06 VITALS — BP 130/70 | HR 68 | Temp 98.0°F | Ht 66.0 in | Wt 192.4 lb

## 2020-07-06 DIAGNOSIS — R5383 Other fatigue: Secondary | ICD-10-CM

## 2020-07-06 DIAGNOSIS — E1159 Type 2 diabetes mellitus with other circulatory complications: Secondary | ICD-10-CM

## 2020-07-06 DIAGNOSIS — E782 Mixed hyperlipidemia: Secondary | ICD-10-CM

## 2020-07-06 DIAGNOSIS — I10 Essential (primary) hypertension: Secondary | ICD-10-CM | POA: Diagnosis not present

## 2020-07-06 DIAGNOSIS — Z0001 Encounter for general adult medical examination with abnormal findings: Secondary | ICD-10-CM | POA: Diagnosis not present

## 2020-07-06 DIAGNOSIS — Z125 Encounter for screening for malignant neoplasm of prostate: Secondary | ICD-10-CM | POA: Diagnosis not present

## 2020-07-06 DIAGNOSIS — R7989 Other specified abnormal findings of blood chemistry: Secondary | ICD-10-CM | POA: Diagnosis not present

## 2020-07-06 LAB — CBC WITH DIFFERENTIAL/PLATELET
Basophils Absolute: 0.1 10*3/uL (ref 0.0–0.1)
Basophils Relative: 0.9 % (ref 0.0–3.0)
Eosinophils Absolute: 0.2 10*3/uL (ref 0.0–0.7)
Eosinophils Relative: 2.9 % (ref 0.0–5.0)
HCT: 49.1 % (ref 39.0–52.0)
Hemoglobin: 16.3 g/dL (ref 13.0–17.0)
Lymphocytes Relative: 31.9 % (ref 12.0–46.0)
Lymphs Abs: 2 10*3/uL (ref 0.7–4.0)
MCHC: 33.2 g/dL (ref 30.0–36.0)
MCV: 90.4 fl (ref 78.0–100.0)
Monocytes Absolute: 0.7 10*3/uL (ref 0.1–1.0)
Monocytes Relative: 11.7 % (ref 3.0–12.0)
Neutro Abs: 3.2 10*3/uL (ref 1.4–7.7)
Neutrophils Relative %: 52.6 % (ref 43.0–77.0)
Platelets: 199 10*3/uL (ref 150.0–400.0)
RBC: 5.43 Mil/uL (ref 4.22–5.81)
RDW: 13.6 % (ref 11.5–15.5)
WBC: 6.1 10*3/uL (ref 4.0–10.5)

## 2020-07-06 LAB — COMPREHENSIVE METABOLIC PANEL
ALT: 24 U/L (ref 0–53)
AST: 23 U/L (ref 0–37)
Albumin: 4.6 g/dL (ref 3.5–5.2)
Alkaline Phosphatase: 57 U/L (ref 39–117)
BUN: 13 mg/dL (ref 6–23)
CO2: 26 mEq/L (ref 19–32)
Calcium: 9.7 mg/dL (ref 8.4–10.5)
Chloride: 105 mEq/L (ref 96–112)
Creatinine, Ser: 1.02 mg/dL (ref 0.40–1.50)
GFR: 75.36 mL/min (ref >60.00)
Glucose, Bld: 124 mg/dL — ABNORMAL HIGH (ref 70–99)
Potassium: 4.6 mEq/L (ref 3.5–5.1)
Sodium: 140 mEq/L (ref 135–145)
Total Bilirubin: 0.6 mg/dL (ref 0.2–1.2)
Total Protein: 7.2 g/dL (ref 6.0–8.3)

## 2020-07-06 LAB — PSA: PSA: 0.3 ng/mL (ref 0.10–4.00)

## 2020-07-06 LAB — TSH: TSH: 2.21 u[IU]/mL (ref 0.35–4.50)

## 2020-07-06 LAB — LIPID PANEL
Cholesterol: 153 mg/dL (ref 0–200)
HDL: 47.5 mg/dL (ref >39.00)
LDL Cholesterol: 67 mg/dL (ref 0–99)
NonHDL: 105.23
Total CHOL/HDL Ratio: 3
Triglycerides: 189 mg/dL — ABNORMAL HIGH (ref 0.0–149.0)
VLDL: 37.8 mg/dL (ref 0.0–40.0)

## 2020-07-06 LAB — MICROALBUMIN / CREATININE URINE RATIO
Creatinine,U: 43.5 mg/dL
Microalb Creat Ratio: 1.8 mg/g (ref 0.0–30.0)
Microalb, Ur: 0.8 mg/dL (ref 0.0–1.9)

## 2020-07-06 LAB — TESTOSTERONE: Testosterone: 250.72 ng/dL — ABNORMAL LOW (ref 300.00–890.00)

## 2020-07-06 LAB — HEMOGLOBIN A1C: Hgb A1c MFr Bld: 6.7 % — ABNORMAL HIGH (ref 4.6–6.5)

## 2020-07-06 NOTE — Progress Notes (Signed)
Tommi Rumps, MD Phone: [phone]  AMIAS HUTCHINSON is a 59 y.o. male who presents today for CPE.  Diet: Still eating pretty healthfully.  Eating salads.  Rare sweet tea.  1 diet soda per day. 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 Colonoscopy: 07/24/2014 with no polyps.  10-year recall. Prostate cancer screening: Due Family history-  Prostate cancer: No  Colon cancer: In an uncle possibly, patient is unable to confirm this Vaccines-   Flu: Patient will get this through work when it is available.  Tetanus: Up-to-date  Shingles: Due  COVID19: Up-to-date  Pneumonia: Up-to-date HIV screening: Up-to-date Hep C Screening: Up-to-date Tobacco use: No Alcohol use: Occasionally has 1-2 beers Illicit Drug use: No Dentist: Yes Ophthalmology: Yes Reports some mildly decreased energy level.  May be related to stress at work.  He notes physically he is able to do whatever he needs to do.  No depression or anxiety.  Reports a history of low testosterone in the past. Reports BP is well controlled at home.  He missed his medication yesterday.   Active Ambulatory Problems    Diagnosis Date Noted  . Diabetes mellitus type 2, controlled ([place]) 05/25/2012  . Hypertension 05/25/2012  . Hyperlipidemia 05/25/2012  . Low testosterone 07/12/2012  . 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 Obesity (BMI 30-39.9) 05/20/2014  . Encounter for general adult medical examination with abnormal findings 07/21/2016  . Left hand pain 12/14/2016  . Sesamoiditis (Left) 12/14/2016  . Arthritis associated with diabetes ([place]) 12/15/2016  . Bilateral hand pain 12/15/2016  . Chronic thumb pain (Left) 06/26/2017  . Localized primary osteoarthritis of carpometacarpal joint of left thumb 06/27/2017  . Arthritis of carpometacarpal [facility]) joint of left thumb 06/27/2017  . Arthritis of carpometacarpal joint 06/27/2017  . Reactive airway disease 10/20/2017  . Aortic  atherosclerosis ([place]) 10/20/2017  . Tinnitus 07/02/2019  . Decreased energy 07/06/2020   Resolved Ambulatory Problems    Diagnosis Date Noted  . Fatigue 05/25/2012  . Obesity (BMI 30-39.9) 05/25/2012  . Skin lesion of scalp 07/12/2012  . Other malaise and fatigue 03/07/2013  . Decreased libido 07/24/2013  . Overweight (BMI 25.0-29.9) 12/12/2013  . Lipid screening 05/20/2014  . Acute nonsuppurative otitis media of right ear 05/20/2014  . Nephrolithiasis 02/24/2017  . Chest pressure 10/20/2017  . Acute pain of right shoulder 07/02/2019  . Close exposure to COVID-19 virus 11/04/2019   Past Medical History:  Diagnosis Date  . Arthritis   . Chicken pox   . Diabetes ([place])   . Difficult intubation   . Diverticulitis   . GERD (gastroesophageal reflux disease)   . History of kidney stones     Family History  Problem Relation Age of Onset  . Hyperlipidemia Mother   . Hypertension Mother   . Hyperlipidemia Father   . Hypertension Father   . Arthritis Maternal Grandmother   . Cancer Maternal Grandmother        breast  . Arthritis Maternal Grandfather   . Cancer Paternal Uncle        colon  . Prostate cancer Neg Hx   . Kidney cancer Neg Hx   . Bladder Cancer Neg Hx     Social History   Socioeconomic History  . Marital status: Married    Spouse name: Not on file  . Number of children: Not on file  . Years of education: Not on file  . Highest education level: Not on file  Occupational History  . Not on file  Tobacco Use  . Smoking status: Former Smoker    Packs/day: 0.50    Years: 5.00    Pack years: 2.50    Types: Cigarettes    Quit date: 11/21/1988    Years since quitting: 31.6  . Smokeless tobacco: Never Used  Substance and Sexual Activity  . Alcohol use: Yes    Comment: occ  . Drug use: No  . Sexual activity: Not on file  Other Topics Concern  . Not on file  Social History Narrative   Lives in [place]. No pets. Works for [employer].      Diet  - regular diet, limited red meat   Exercise - walks   Social Determinants of Health   Financial Resource Strain:   . Difficulty of Paying Living Expenses:   Food Insecurity:   . Worried About Charity fundraiser in the Last Year:   . Arboriculturist in the Last Year:   Transportation Needs:   . Film/video editor (Medical):   Marland Kitchen Lack of Transportation (Non-Medical):   Physical Activity:   . Days of Exercise per Week:   . Minutes of Exercise per Session:   Stress:   . Feeling of Stress :   Social Connections:   . Frequency of Communication with Friends and Family:   . Frequency of Social Gatherings with Friends and Family:   . Attends Religious Services:   . Active Member of Clubs or Organizations:   . Attends Archivist Meetings:   Marland Kitchen Marital Status:   Intimate Partner Violence:   . Fear of Current or Ex-Partner:   . Emotionally Abused:   Marland Kitchen Physically Abused:   . Sexually Abused:     ROS  General:  Negative for nexplained weight loss, fever Skin: Negative for new or changing mole, sore that won't heal HEENT: Negative for trouble hearing, trouble seeing, ringing in ears, mouth sores, hoarseness, change in voice, dysphagia. CV:  Negative for chest pain, dyspnea, edema, palpitations Resp: Negative for cough, dyspnea, hemoptysis GI: Negative for nausea, vomiting, diarrhea, constipation, abdominal pain, melena, hematochezia. GU: Negative for dysuria, incontinence, urinary hesitance, hematuria, vaginal or penile discharge, polyuria, sexual difficulty, lumps in testicle or breasts MSK: Negative for muscle cramps or aches, joint pain or swelling Neuro: Negative for headaches, weakness, numbness, dizziness, passing out/fainting Psych: Negative for depression, anxiety, memory problems  Objective  Physical Exam Vitals:   07/06/20 0858  BP: 130/70  Pulse: 68  Temp: 98 F (36.7 C)  SpO2: 96%    BP Readings from Last 3 Encounters:  07/06/20 130/70  03/06/20  130/80  07/02/19 110/80   Wt Readings from Last 3 Encounters:  07/06/20 192 lb 6.4 oz (87.3 kg)  03/06/20 194 lb 12.8 oz (88.4 kg)  11/04/19 185 lb (83.9 kg)    Physical Exam Constitutional:      General: He is not in acute distress.    Appearance: He is not diaphoretic.  HENT:     Head: Normocephalic and atraumatic.  Eyes:     Conjunctiva/sclera: Conjunctivae normal.     Pupils: Pupils are equal, round, and reactive to light.  Cardiovascular:     Rate and Rhythm: Normal rate and regular rhythm.     Heart sounds: Normal heart sounds.  Pulmonary:     Effort: Pulmonary effort is normal.     Breath sounds: Normal breath sounds.  Abdominal:     General: Bowel  sounds are normal. There is no distension.     Palpations: Abdomen is soft.     Tenderness: There is no abdominal tenderness. There is no guarding or rebound.  Musculoskeletal:     Right lower leg: No edema.     Left lower leg: No edema.  Lymphadenopathy:     Cervical: No cervical adenopathy.  Skin:    General: Skin is warm and dry.  Neurological:     Mental Status: He is alert.  Psychiatric:        Mood and Affect: Mood normal.      Assessment/Plan:   Encounter for general adult medical examination with abnormal findings Physical exam completed.  Encouraged healthy diet and exercise.  Discussed decreasing soda intake.  Encouraged him to get the flu vaccine.  Discussed Shingrix vaccine.  He will check with his insurance regarding coverage.  Discussed having him continue to see an eye doctor once yearly.  Encouraged him to take his blood pressure medicines daily.  Prostate cancer screening with PSA.  Lab work as outlined below.  Decreased energy Possibly related to stress at work.  Denies depression anxiety.  We will get lab work as outlined below to look for possible causes.   Orders Placed This Encounter  Procedures  . Urine Microalbumin w/creat. ratio  . HgB A1c  . PSA  . TSH  . Testosterone  . CBC w/Diff   . Lipid panel  . Comp Met (CMET)    No orders of the defined types were placed in this encounter.   This visit occurred during the SARS-CoV-2 public health emergency.  Safety protocols were in place, including screening questions prior to the visit, additional usage of staff PPE, and extensive cleaning of exam room while observing appropriate contact time as indicated for disinfecting solutions.    Tommi Rumps, MD [place]

## 2020-07-06 NOTE — Assessment & Plan Note (Signed)
Physical exam completed.  Encouraged healthy diet and exercise.  Discussed decreasing soda intake.  Encouraged him to get the flu vaccine.  Discussed Shingrix vaccine.  He will check with his insurance regarding coverage.  Discussed having him continue to see an eye doctor once yearly.  Encouraged him to take his blood pressure medicines daily.  Prostate cancer screening with PSA.  Lab work as outlined below.

## 2020-07-06 NOTE — Assessment & Plan Note (Signed)
Possibly related to stress at work.  Denies depression anxiety.  We will get lab work as outlined below to look for possible causes.

## 2020-07-06 NOTE — Patient Instructions (Addendum)
Nice to see you. We will get lab work today and contact you with results. Please try to decrease your soda intake.

## 2020-07-07 ENCOUNTER — Other Ambulatory Visit: Payer: Self-pay | Admitting: Family Medicine

## 2020-07-07 ENCOUNTER — Encounter: Payer: Self-pay | Admitting: Family Medicine

## 2020-07-07 DIAGNOSIS — R7989 Other specified abnormal findings of blood chemistry: Secondary | ICD-10-CM

## 2020-07-28 ENCOUNTER — Telehealth: Payer: Self-pay | Admitting: Family Medicine

## 2020-07-28 NOTE — Telephone Encounter (Signed)
Rejection Reason - Patient was No Show - Unable to contact patient to schedule appointment as patient did not respond to scheduling attempts. If patient still wants an appt with endocrinology, please place a new referral." [facility] said about 6 hours ago

## 2020-07-29 NOTE — Telephone Encounter (Signed)
Please follow-up with the patient to make sure that he still want to see endocrinology.  If he does he would need to be referred again.

## 2020-07-29 NOTE — Telephone Encounter (Signed)
I called and spoke with the patient and he stated he scheduled the appointment and it is in October.  Moncerrath Berhe,cma

## 2020-08-09 ENCOUNTER — Other Ambulatory Visit: Payer: Self-pay | Admitting: Family Medicine

## 2020-09-07 ENCOUNTER — Other Ambulatory Visit: Payer: Self-pay | Admitting: Family Medicine

## 2020-10-10 ENCOUNTER — Other Ambulatory Visit: Payer: Self-pay | Admitting: Family Medicine

## 2020-11-16 ENCOUNTER — Other Ambulatory Visit: Payer: Self-pay | Admitting: Family Medicine

## 2020-11-21 ENCOUNTER — Other Ambulatory Visit: Payer: Self-pay | Admitting: Family Medicine

## 2020-11-28 ENCOUNTER — Other Ambulatory Visit: Payer: Self-pay | Admitting: Family Medicine

## 2020-12-13 ENCOUNTER — Other Ambulatory Visit: Payer: Self-pay | Admitting: Family Medicine

## 2021-01-04 ENCOUNTER — Other Ambulatory Visit: Payer: Self-pay

## 2021-01-06 ENCOUNTER — Ambulatory Visit: Payer: Managed Care, Other (non HMO) | Admitting: Family Medicine

## 2021-02-03 ENCOUNTER — Other Ambulatory Visit: Payer: Self-pay | Admitting: Family Medicine

## 2021-02-17 ENCOUNTER — Encounter: Payer: Self-pay | Admitting: Family Medicine

## 2021-02-17 ENCOUNTER — Ambulatory Visit: Payer: Managed Care, Other (non HMO) | Admitting: Family Medicine

## 2021-02-17 ENCOUNTER — Other Ambulatory Visit: Payer: Self-pay

## 2021-02-17 DIAGNOSIS — E782 Mixed hyperlipidemia: Secondary | ICD-10-CM

## 2021-02-17 DIAGNOSIS — E1159 Type 2 diabetes mellitus with other circulatory complications: Secondary | ICD-10-CM

## 2021-02-17 DIAGNOSIS — I700 Atherosclerosis of aorta: Secondary | ICD-10-CM

## 2021-02-17 DIAGNOSIS — I10 Essential (primary) hypertension: Secondary | ICD-10-CM

## 2021-02-17 LAB — POCT GLYCOSYLATED HEMOGLOBIN (HGB A1C): Hemoglobin A1C: 6.8 % — AB (ref 4.0–5.6)

## 2021-02-17 NOTE — Patient Instructions (Signed)
Nice to see you. We will check your A1c today. Please continue with your current medication regimen.

## 2021-02-17 NOTE — Assessment & Plan Note (Signed)
Continue risk factor management. 

## 2021-02-17 NOTE — Assessment & Plan Note (Signed)
Check A1c.  Continue Metformin 1000 mg twice daily and Jardiance 10 mg daily. 

## 2021-02-17 NOTE — Progress Notes (Signed)
Marikay Alar, MD Phone: [phone]  Caleb Warren is a 60 y.o. male who presents today for f/u.  HYPERTENSION  Disease Monitoring  Home BP Monitoring not checking Chest pain- no    Dyspnea- no Medications  Compliance-  Taking amlodipine.   Edema- no  DIABETES Disease Monitoring: Blood Sugar ranges-not checking Polyuria/phagia/dipsia- no      Optho- due Medications: Compliance- taking metformin and jardiance Hypoglycemic symptoms- no  HYPERLIPIDEMIA Symptoms Chest pain on exertion:  no    Medications: Compliance- taking repatha  Muscle aches- no       Social History   Tobacco Use  Smoking Status Former Smoker  . Packs/day: 0.50  . Years: 5.00  . Pack years: 2.50  . Types: Cigarettes  . Quit date: 11/21/1988  . Years since quitting: 32.2  Smokeless Tobacco Never Used    Current Outpatient Medications on File Prior to Visit  Medication Sig Dispense Refill  . albuterol (PROVENTIL HFA;VENTOLIN HFA) 108 (90 Base) MCG/ACT inhaler TAKE 2 PUFFS BY MOUTH EVERY 6 HOURS AS NEEDED FOR WHEEZE OR SHORTNESS OF BREATH 18 Inhaler 0  . amLODipine (NORVASC) 10 MG tablet TAKE 1 TABLET BY MOUTH EVERY DAY 30 tablet 2  . aspirin 81 MG tablet Take 81 mg by mouth daily.    . clomiPHENE (CLOMID) 50 MG tablet Take 0.5 tablets by mouth daily.    Marland Kitchen JARDIANCE 10 MG TABS tablet TAKE 1 TABLET BY MOUTH EVERY DAY 90 tablet 1  . metFORMIN (GLUCOPHAGE) 1000 MG tablet TAKE 1 TABLET (1,000 MG TOTAL) BY MOUTH 2 (TWO) TIMES DAILY WITH A MEAL. 180 tablet 1  . omeprazole (PRILOSEC OTC) 20 MG tablet Take 20 mg by mouth every 3 (three) days.    Marland Kitchen REPATHA 140 MG/ML SOSY INJECT 140MG  INTO THE SKIN EVERY 14 DAYS 4 mL 0   No current facility-administered medications on file prior to visit.     ROS see history of present illness  Objective  Physical Exam Vitals:   02/17/21 0804  BP: 130/80  Pulse: 69  Temp: 97.8 F (36.6 C)  SpO2: 98%    BP Readings from Last 3 Encounters:  02/17/21 130/80   07/06/20 130/70  03/06/20 130/80   Wt Readings from Last 3 Encounters:  02/17/21 195 lb 12.8 oz (88.8 kg)  07/06/20 192 lb 6.4 oz (87.3 kg)  03/06/20 194 lb 12.8 oz (88.4 kg)    Physical Exam Constitutional:      General: He is not in acute distress.    Appearance: He is not diaphoretic.  Cardiovascular:     Rate and Rhythm: Normal rate and regular rhythm.     Heart sounds: Normal heart sounds.  Pulmonary:     Effort: Pulmonary effort is normal.     Breath sounds: Normal breath sounds.  Skin:    General: Skin is warm and dry.  Neurological:     Mental Status: He is alert.      Assessment/Plan: Please see individual problem list.  Problem List Items Addressed This Visit    Aortic atherosclerosis (HCC)    Continue risk factor management.      Diabetes mellitus type 2, controlled (HCC)    Check A1c.  Continue Metformin 1000 mg twice daily and Jardiance 10 mg daily.      Relevant Orders   POCT HgB A1C   Hyperlipidemia    Continue Repatha injection once every 14 days.  LDL has been adequately controlled.      Hypertension  Stable.  He will continue amlodipine 10 mg once daily.  Discussed BP goal of 130/80 or less.  Advised to start checking his blood pressure daily.  If it starts running higher than that he will let us know.          Health Maintenance: I encouraged the patient to schedule appointment with his ophthalmologist.     This visit occurred during the SARS-CoV-2 public health emergency.  Safety protocols were in place, including screening questions prior to the visit, additional usage of staff PPE, and extensive cleaning of exam room while observing appropriate contact time as indicated for disinfecting solutions.    Marikay Alar, MD [facility]

## 2021-02-17 NOTE — Assessment & Plan Note (Signed)
Continue Repatha injection once every 14 days.  LDL has been adequately controlled.

## 2021-02-17 NOTE — Assessment & Plan Note (Addendum)
Stable.  He will continue amlodipine 10 mg once daily.  Discussed BP goal of 130/80 or less.  Advised to start checking his blood pressure daily.  If it starts running higher than that he will let us know.

## 2021-02-23 ENCOUNTER — Other Ambulatory Visit: Payer: Self-pay | Admitting: Family Medicine

## 2021-04-13 ENCOUNTER — Other Ambulatory Visit: Payer: Self-pay | Admitting: Family Medicine

## 2021-04-13 MED ORDER — REPATHA 140 MG/ML ~~LOC~~ SOSY: 140.0000 mg | PREFILLED_SYRINGE | SUBCUTANEOUS | 0 refills | Status: DC

## 2021-05-13 LAB — HM DIABETES EYE EXAM

## 2021-05-21 ENCOUNTER — Other Ambulatory Visit: Payer: Self-pay | Admitting: Family Medicine

## 2021-06-12 ENCOUNTER — Other Ambulatory Visit: Payer: Self-pay | Admitting: Family Medicine

## 2021-06-14 ENCOUNTER — Other Ambulatory Visit: Payer: Self-pay | Admitting: Family Medicine

## 2021-08-10 ENCOUNTER — Other Ambulatory Visit: Payer: Self-pay | Admitting: Family Medicine

## 2021-08-20 ENCOUNTER — Ambulatory Visit: Payer: Managed Care, Other (non HMO) | Admitting: Family Medicine

## 2021-08-21 ENCOUNTER — Other Ambulatory Visit: Payer: Self-pay | Admitting: Family Medicine

## 2021-09-20 ENCOUNTER — Telehealth: Payer: Self-pay | Admitting: Family Medicine

## 2021-09-20 ENCOUNTER — Ambulatory Visit: Payer: Managed Care, Other (non HMO) | Admitting: Family Medicine

## 2021-09-20 ENCOUNTER — Encounter: Payer: Self-pay | Admitting: Family Medicine

## 2021-09-20 ENCOUNTER — Other Ambulatory Visit: Payer: Self-pay

## 2021-09-20 VITALS — BP 125/80 | HR 70 | Temp 98.0°F | Ht 66.0 in | Wt 187.2 lb

## 2021-09-20 DIAGNOSIS — I10 Essential (primary) hypertension: Secondary | ICD-10-CM

## 2021-09-20 DIAGNOSIS — E1159 Type 2 diabetes mellitus with other circulatory complications: Secondary | ICD-10-CM

## 2021-09-20 DIAGNOSIS — E782 Mixed hyperlipidemia: Secondary | ICD-10-CM

## 2021-09-20 DIAGNOSIS — Z125 Encounter for screening for malignant neoplasm of prostate: Secondary | ICD-10-CM | POA: Diagnosis not present

## 2021-09-20 DIAGNOSIS — Z23 Encounter for immunization: Secondary | ICD-10-CM | POA: Diagnosis not present

## 2021-09-20 LAB — MICROALBUMIN / CREATININE URINE RATIO
Creatinine,U: 67.7 mg/dL
Microalb Creat Ratio: 1 mg/g (ref 0.0–30.0)
Microalb, Ur: <0.7 mg/dL (ref 0.0–1.9)

## 2021-09-20 LAB — COMPREHENSIVE METABOLIC PANEL
ALT: 14 U/L (ref 0–53)
AST: 19 U/L (ref 0–37)
Albumin: 4.5 g/dL (ref 3.5–5.2)
Alkaline Phosphatase: 43 U/L (ref 39–117)
BUN: 14 mg/dL (ref 6–23)
CO2: 26 mEq/L (ref 19–32)
Calcium: 9.1 mg/dL (ref 8.4–10.5)
Chloride: 106 mEq/L (ref 96–112)
Creatinine, Ser: 1.14 mg/dL (ref 0.40–1.50)
GFR: 71.2 mL/min (ref >60.00)
Glucose, Bld: 104 mg/dL — ABNORMAL HIGH (ref 70–99)
Potassium: 4.3 mEq/L (ref 3.5–5.1)
Sodium: 142 mEq/L (ref 135–145)
Total Bilirubin: 0.6 mg/dL (ref 0.2–1.2)
Total Protein: 6.9 g/dL (ref 6.0–8.3)

## 2021-09-20 LAB — LIPID PANEL
Cholesterol: 140 mg/dL (ref 0–200)
HDL: 44.7 mg/dL (ref >39.00)
NonHDL: 95.13
Total CHOL/HDL Ratio: 3
Triglycerides: 206 mg/dL — ABNORMAL HIGH (ref 0.0–149.0)
VLDL: 41.2 mg/dL — ABNORMAL HIGH (ref 0.0–40.0)

## 2021-09-20 LAB — HEMOGLOBIN A1C: Hgb A1c MFr Bld: 6.3 % (ref 4.6–6.5)

## 2021-09-20 LAB — LDL CHOLESTEROL, DIRECT: Direct LDL: 62 mg/dL

## 2021-09-20 LAB — PSA: PSA: 0.29 ng/mL (ref 0.10–4.00)

## 2021-09-20 MED ORDER — OZEMPIC (0.25 OR 0.5 MG/DOSE) 2 MG/1.5ML ~~LOC~~ SOPN: PEN_INJECTOR | SUBCUTANEOUS | 1 refills | Status: DC

## 2021-09-20 MED ORDER — METFORMIN HCL ER 500 MG PO TB24: 1000.0000 mg | ORAL_TABLET | Freq: Every day | ORAL | 1 refills | Status: DC

## 2021-09-20 NOTE — Assessment & Plan Note (Signed)
This has been well controlled.  He is interested in switching over to Ozempic given the benefit for weight loss.  He notes no personal or family history of thyroid cancer, parathyroid cancer, or adrenal gland cancer.  No history of gallbladder disease or pancreatitis.  I discussed the risk of nausea, gallbladder disease, and pancreatitis with this medication.  Discussed that a specific kind of thyroid cancer has been seen in rats studies.  It does not seem that this translates into humans though he needs to be aware of the risk of thyroid cancer with this medication.  He will monitor his thyroid area and if he notices any changes he will let us know.  Ozempic will be sent to his pharmacy.  We will also switch him over to extended release metformin 1000 mg once daily to see if he is able to tolerate that better.

## 2021-09-20 NOTE — Progress Notes (Signed)
Tommi Rumps, MD Phone: [phone]  Caleb Warren is a 60 y.o. male who presents today for f/u.  HYPERTENSION Disease Monitoring: Blood pressure range-"great"  Chest pain- no      Dyspnea- no Medications: Compliance- taking amlodipine    Edema- no  DIABETES Disease Monitoring: Blood Sugar ranges-no highs Polyuria/phagia/dipsia- no      Optho- UTD Medications: Compliance- taking jardiance, metformin (can only tolerate 1000 mg once daily) Hypoglycemic symptoms- no Patient is interested in switching over to ozempic instead of the jardiance as he knows some people that have had good luck with this for diabetes and weight loss.  HYPERLIPIDEMIA Disease Monitoring: See symptoms for Hypertension Medications: Compliance- taing repatha Right upper quadrant pain- no  Muscle aches- no    Social History   Tobacco Use  Smoking Status Former   Packs/day: 0.50   Years: 5.00   Pack years: 2.50   Types: Cigarettes   Quit date: 11/21/1988   Years since quitting: 32.8  Smokeless Tobacco Never    Current Outpatient Medications on File Prior to Visit  Medication Sig Dispense Refill   albuterol (PROVENTIL HFA;VENTOLIN HFA) 108 (90 Base) MCG/ACT inhaler TAKE 2 PUFFS BY MOUTH EVERY 6 HOURS AS NEEDED FOR WHEEZE OR SHORTNESS OF BREATH 18 Inhaler 0   amLODipine (NORVASC) 10 MG tablet TAKE 1 TABLET BY MOUTH EVERY DAY 90 tablet 1   aspirin 81 MG tablet Take 81 mg by mouth daily.     clomiPHENE (CLOMID) 50 MG tablet Take 0.5 tablets by mouth daily.     JARDIANCE 10 MG TABS tablet TAKE 1 TABLET BY MOUTH EVERY DAY 90 tablet 1   omeprazole (PRILOSEC OTC) 20 MG tablet Take 20 mg by mouth every 3 (three) days.     REPATHA 140 MG/ML SOSY INJECT 140 MG UNDER THE SKIN EVERY 14 DAYS 4 mL 0   No current facility-administered medications on file prior to visit.     ROS see history of present illness  Objective  Physical Exam Vitals:   09/20/21 0929  BP: 125/80  Pulse: 70  Temp: 98 F (36.7  C)  SpO2: 98%    BP Readings from Last 3 Encounters:  09/20/21 125/80  02/17/21 130/80  07/06/20 130/70   Wt Readings from Last 3 Encounters:  09/20/21 187 lb 3.2 oz (84.9 kg)  02/17/21 195 lb 12.8 oz (88.8 kg)  07/06/20 192 lb 6.4 oz (87.3 kg)    Physical Exam Constitutional:      General: He is not in acute distress.    Appearance: He is not diaphoretic.  Cardiovascular:     Rate and Rhythm: Normal rate and regular rhythm.     Heart sounds: Normal heart sounds.  Pulmonary:     Effort: Pulmonary effort is normal.     Breath sounds: Normal breath sounds.  Musculoskeletal:     Right lower leg: No edema.     Left lower leg: No edema.  Skin:    General: Skin is warm and dry.  Neurological:     Mental Status: He is alert.     Assessment/Plan: Please see individual problem list.  Problem List Items Addressed This Visit     Diabetes mellitus type 2, controlled ([place])    This has been well controlled.  He is interested in switching over to Ozempic given the benefit for weight loss.  He notes no personal or family history of thyroid cancer, parathyroid cancer, or adrenal gland cancer.  No history of gallbladder disease  or pancreatitis.  I discussed the risk of nausea, gallbladder disease, and pancreatitis with this medication.  Discussed that a specific kind of thyroid cancer has been seen in rats studies.  It does not seem that this translates into humans though he needs to be aware of the risk of thyroid cancer with this medication.  He will monitor his thyroid area and if he notices any changes he will let us know.  Ozempic will be sent to his pharmacy.  We will also switch him over to extended release metformin 1000 mg once daily to see if he is able to tolerate that better.      Relevant Medications   metFORMIN (GLUCOPHAGE XR) 500 MG 24 hr tablet   Semaglutide,0.25 or 0.5MG/DOS, (OZEMPIC, 0.25 OR 0.5 MG/DOSE,) 2 MG/1.5ML SOPN   Other Relevant Orders   Comp Met (CMET)    HgB A1c   Urine Microalbumin w/creat. ratio   Hyperlipidemia    Continue Repatha.  Check lipid panel.      Relevant Orders   Comp Met (CMET)   Lipid panel   Hypertension - Primary    Adequately controlled.  He will continue amlodipine 10 mg once daily.      Relevant Orders   Comp Met (CMET)   Other Visit Diagnoses     Need for immunization against influenza       Relevant Orders   Flu Vaccine QUAD 79moIM (Fluarix, Fluzone & Alfiuria Quad PF) (Completed)   Prostate cancer screening       Relevant Orders   PSA   Need for tetanus booster       Relevant Orders   Td : Tetanus/diphtheria >7yo Preservative  free (Completed)        Health Maintenance: prevnar 20 to be given at next visit.   Return in about 3 months (around 12/21/2021) for Diabetes/weight.  This visit occurred during the SARS-CoV-2 public health emergency.  Safety protocols were in place, including screening questions prior to the visit, additional usage of staff PPE, and extensive cleaning of exam room while observing appropriate contact time as indicated for disinfecting solutions.    ETommi Rumps MD [facility]

## 2021-09-20 NOTE — Patient Instructions (Signed)
Nice to see you. We will switch you over to the Ozempic.  We will also switch you to metformin XR. If you notice any nausea, abdominal pain, or changes in your thyroid area on the Ozempic please let us know. We will get lab work today and let you know what the results are.

## 2021-09-20 NOTE — Telephone Encounter (Signed)
Patient called stating Metformin ER filled Needing auth for Ozempic.

## 2021-09-20 NOTE — Assessment & Plan Note (Signed)
Continue Repatha.  Check lipid panel.

## 2021-09-20 NOTE — Assessment & Plan Note (Signed)
Adequately controlled.  He will continue amlodipine 10 mg once daily.

## 2021-09-28 NOTE — Telephone Encounter (Signed)
I tried to do a PA on the Ozempic and the message from cover my meds is that the medication was covered, I called the pharmacy and the representative stated the patient had already picked up the medication, no PA was needed.  Rochele Lueck,cma

## 2021-10-02 ENCOUNTER — Other Ambulatory Visit: Payer: Self-pay | Admitting: Family Medicine

## 2021-11-10 ENCOUNTER — Telehealth: Payer: Managed Care, Other (non HMO) | Admitting: Family

## 2021-11-10 DIAGNOSIS — R197 Diarrhea, unspecified: Secondary | ICD-10-CM

## 2021-11-10 DIAGNOSIS — R109 Unspecified abdominal pain: Secondary | ICD-10-CM

## 2021-11-10 NOTE — Progress Notes (Signed)
Based on what you shared with me, I feel your condition warrants further evaluation and I recommend that you be seen in a face to face visit.  Given your current symptoms, you need to be seen in person for evaluation to rule out a serious problem.    NOTE: There will be NO CHARGE for this eVisit   If you are having a true medical emergency please call 911.      For an urgent face to face visit, [place] has six urgent care centers for your convenience:     [facility] at [facility] Directions [phone] [address] [place], [place] [address]    [facility] Health Urgent Care Center [facility]) Get Driving Directions [phone] [address] [place], [place] [address]  [facility]) Get Driving Directions [phone] [address] [place],  [place]  [address]  [facility] Health Urgent Care at [facility] Get Driving Directions [phone] [address], [address] [place], [place] [address]   [facility] Health Urgent Care at [facility] Get Driving Directions  [phone] [address] [place], [place] [address]   [facility] Health Urgent Care at [facility] Directions [phone] [address], [address] [place], [place] [address]  Your MyChart E-visit questionnaire answers were reviewed by a board certified advanced clinical practitioner to complete your personal care plan based on your specific symptoms.  Thank you for using e-Visits.

## 2021-11-11 ENCOUNTER — Ambulatory Visit: Payer: Managed Care, Other (non HMO)

## 2021-11-23 ENCOUNTER — Ambulatory Visit (LOCAL_COMMUNITY_HEALTH_CENTER): Payer: Self-pay

## 2021-11-23 ENCOUNTER — Other Ambulatory Visit: Payer: Self-pay

## 2021-11-23 DIAGNOSIS — Z111 Encounter for screening for respiratory tuberculosis: Secondary | ICD-10-CM

## 2021-11-26 ENCOUNTER — Other Ambulatory Visit: Payer: Self-pay

## 2021-11-26 ENCOUNTER — Ambulatory Visit (LOCAL_COMMUNITY_HEALTH_CENTER): Payer: Self-pay

## 2021-11-26 DIAGNOSIS — Z111 Encounter for screening for respiratory tuberculosis: Secondary | ICD-10-CM

## 2021-11-26 LAB — TB SKIN TEST
Induration: 0 mm
TB Skin Test: NEGATIVE

## 2021-12-08 ENCOUNTER — Other Ambulatory Visit: Payer: Self-pay | Admitting: Family Medicine

## 2021-12-18 ENCOUNTER — Other Ambulatory Visit: Payer: Self-pay | Admitting: Family Medicine

## 2021-12-20 MED ORDER — REPATHA 140 MG/ML ~~LOC~~ SOSY: 1.0000 "application " | PREFILLED_SYRINGE | SUBCUTANEOUS | 3 refills | Status: DC

## 2021-12-31 ENCOUNTER — Ambulatory Visit: Payer: Managed Care, Other (non HMO) | Admitting: Family Medicine

## 2022-01-26 ENCOUNTER — Ambulatory Visit: Payer: Managed Care, Other (non HMO) | Admitting: Family Medicine

## 2022-01-26 ENCOUNTER — Encounter: Payer: Self-pay | Admitting: Family Medicine

## 2022-01-26 ENCOUNTER — Other Ambulatory Visit: Payer: Self-pay

## 2022-01-26 VITALS — BP 110/78 | HR 73 | Temp 98.0°F | Ht 66.0 in | Wt 171.0 lb

## 2022-01-26 DIAGNOSIS — Z125 Encounter for screening for malignant neoplasm of prostate: Secondary | ICD-10-CM

## 2022-01-26 DIAGNOSIS — E1159 Type 2 diabetes mellitus with other circulatory complications: Secondary | ICD-10-CM

## 2022-01-26 DIAGNOSIS — E782 Mixed hyperlipidemia: Secondary | ICD-10-CM | POA: Diagnosis not present

## 2022-01-26 DIAGNOSIS — I10 Essential (primary) hypertension: Secondary | ICD-10-CM | POA: Diagnosis not present

## 2022-01-26 DIAGNOSIS — I700 Atherosclerosis of aorta: Secondary | ICD-10-CM | POA: Diagnosis not present

## 2022-01-26 LAB — POCT GLYCOSYLATED HEMOGLOBIN (HGB A1C): Hemoglobin A1C: 5.4 % (ref 4.0–5.6)

## 2022-01-26 NOTE — Assessment & Plan Note (Signed)
Previously well controlled. Continue repatha.   

## 2022-01-26 NOTE — Assessment & Plan Note (Signed)
Well controlled. He will continue ozempic 0.5 mg weekly and metformin XR 1000 mg daily. ?

## 2022-01-26 NOTE — Assessment & Plan Note (Signed)
Continue risk factor management. 

## 2022-01-26 NOTE — Progress Notes (Signed)
?Caleb Rumps, MD ?Phone: [phone] ? ?Caleb Warren is a 61 y.o. male who presents today for f/u. ? ?HYPERTENSION ?Disease Monitoring: ?Blood pressure range-not checking Chest pain- no      Dyspnea- no ?Medications: ?Compliance- taking amlodipine  Edema- no ? ?DIABETES ?Disease Monitoring: ?Blood Sugar ranges-90s Polyuria/phagia/dipsia- no      Optho- UTD ?Medications: ?Compliance- taking metformin XR and ozempic Hypoglycemic symptoms- no ?[place].  ? ?HYPERLIPIDEMIA ?Disease Monitoring: ?See symptoms for Hypertension ?Medications: ?Compliance- taking repatha Right upper quadrant pain- no  Muscle aches- no ?No claudication. ? ? ? ?Social History  ? ?Tobacco Use  ?Smoking Status Former  ? Packs/day: 0.50  ? Years: 5.00  ? Pack years: 2.50  ? Types: Cigarettes  ? Quit date: 11/21/1988  ? Years since quitting: 33.2  ?Smokeless Tobacco Never  ? ? ?Current Outpatient Medications on File Prior to Visit  ?Medication Sig Dispense Refill  ? albuterol (PROVENTIL HFA;VENTOLIN HFA) 108 (90 Base) MCG/ACT inhaler TAKE 2 PUFFS BY MOUTH EVERY 6 HOURS AS NEEDED FOR WHEEZE OR SHORTNESS OF BREATH 18 Inhaler 0  ? amLODipine (NORVASC) 10 MG tablet TAKE 1 TABLET BY MOUTH EVERY DAY 90 tablet 1  ? aspirin 81 MG tablet Take 81 mg by mouth daily.    ? clomiPHENE (CLOMID) 50 MG tablet Take 0.5 tablets by mouth daily.    ? Evolocumab (REPATHA) 140 MG/ML SOSY Inject 1 application into the skin every 14 (fourteen) days. 4 mL 3  ? JARDIANCE 10 MG TABS tablet TAKE 1 TABLET BY MOUTH EVERY DAY 90 tablet 1  ? metFORMIN (GLUCOPHAGE XR) 500 MG 24 hr tablet Take 2 tablets (1,000 mg total) by mouth daily with breakfast. 180 tablet 1  ? omeprazole (PRILOSEC OTC) 20 MG tablet Take 20 mg by mouth every 3 (three) days.    ? ?No current facility-administered medications on file prior to visit.  ? ? ? ?ROS see history of present illness ? ?Objective ? ?Physical Exam ?Vitals:  ? 01/26/22 0813  ?BP: 110/78  ?Pulse: 73  ?Temp: 98 ?F (36.7  ?C)  ?SpO2: 98%  ? ? ?BP Readings from Last 3 Encounters:  ?01/26/22 110/78  ?09/20/21 125/80  ?02/17/21 130/80  ? ?Wt Readings from Last 3 Encounters:  ?01/26/22 171 lb (77.6 kg)  ?09/20/21 187 lb 3.2 oz (84.9 kg)  ?02/17/21 195 lb 12.8 oz (88.8 kg)  ? ? ?Physical Exam ?Constitutional:   ?   General: He is not in acute distress. ?   Appearance: He is not diaphoretic.  ?Cardiovascular:  ?   Rate and Rhythm: Normal rate and regular rhythm.  ?   Heart sounds: Normal heart sounds.  ?Pulmonary:  ?   Effort: Pulmonary effort is normal.  ?   Breath sounds: Normal breath sounds.  ?Musculoskeletal:  ?   Right lower leg: No edema.  ?   Left lower leg: No edema.  ?Skin: ?   General: Skin is warm and dry.  ?Neurological:  ?   Mental Status: He is alert.  ? ? ? ?Assessment/Plan: Please see individual problem list. ? ?Problem List Items Addressed This Visit   ? ? Aortic atherosclerosis ([place])  ?  Continue risk factor management.  ?  ?  ? Relevant Orders  ? Lipid panel  ? Comp Met (CMET)  ? Diabetes mellitus type 2, controlled ([place])  ?  Well controlled. He will continue ozempic 0.5 mg weekly and metformin XR 1000 mg daily. ?  ?  ?  Relevant Orders  ? POCT HgB A1C (Completed)  ? HgB A1c  ? Hyperlipidemia  ?  Previously well controlled. Continue repatha.  ?  ?  ? Relevant Orders  ? Lipid panel  ? Comp Met (CMET)  ? Hypertension - Primary  ?  Well controlled. He will continue amlodipine 10 mg daily.  ?  ?  ? Relevant Orders  ? Lipid panel  ? Comp Met (CMET)  ? ?Other Visit Diagnoses   ? ? Prostate cancer screening      ? Relevant Orders  ? PSA  ? ?  ? ? ?Return in about 8 months (around 09/28/2022) for DM, HTN, HLD, labs 2 days prior. ? ?This visit occurred during the SARS-CoV-2 public health emergency.  Safety protocols were in place, including screening questions prior to the visit, additional usage of staff PPE, and extensive cleaning of exam room while observing appropriate contact time as indicated for disinfecting solutions.   ? ? ?Caleb Rumps, MD ?[place] ? ?

## 2022-01-26 NOTE — Assessment & Plan Note (Signed)
Well controlled. He will continue amlodipine 10 mg daily.  ?

## 2022-01-26 NOTE — Patient Instructions (Signed)
Nice to see you. ?Your A1c is very well controlled.  Please continue with your current regimen. ?

## 2022-03-09 ENCOUNTER — Other Ambulatory Visit: Payer: Self-pay | Admitting: Family Medicine

## 2022-03-09 DIAGNOSIS — E1159 Type 2 diabetes mellitus with other circulatory complications: Secondary | ICD-10-CM

## 2022-03-15 ENCOUNTER — Other Ambulatory Visit: Payer: Self-pay | Admitting: Family Medicine

## 2022-05-29 ENCOUNTER — Other Ambulatory Visit: Payer: Self-pay | Admitting: Family Medicine

## 2022-05-30 NOTE — Telephone Encounter (Signed)
I called and spoke with patient about medication. Usually filled by endo Dr. Val EagleElveria Rising and he will contact him for refill.

## 2022-06-06 LAB — HM DIABETES EYE EXAM

## 2022-09-03 ENCOUNTER — Other Ambulatory Visit: Payer: Self-pay | Admitting: Family Medicine

## 2022-09-03 DIAGNOSIS — E1159 Type 2 diabetes mellitus with other circulatory complications: Secondary | ICD-10-CM

## 2022-09-14 ENCOUNTER — Other Ambulatory Visit: Payer: Self-pay | Admitting: Family Medicine

## 2022-09-21 ENCOUNTER — Other Ambulatory Visit: Payer: Self-pay | Admitting: Family Medicine

## 2022-09-24 ENCOUNTER — Other Ambulatory Visit: Payer: Self-pay | Admitting: Family Medicine

## 2022-09-27 ENCOUNTER — Other Ambulatory Visit: Payer: Managed Care, Other (non HMO)

## 2022-09-30 ENCOUNTER — Ambulatory Visit: Payer: Managed Care, Other (non HMO) | Admitting: Family Medicine

## 2022-10-06 ENCOUNTER — Other Ambulatory Visit: Payer: Self-pay | Admitting: Family Medicine

## 2022-10-19 ENCOUNTER — Other Ambulatory Visit (INDEPENDENT_AMBULATORY_CARE_PROVIDER_SITE_OTHER): Payer: Managed Care, Other (non HMO)

## 2022-10-19 DIAGNOSIS — Z125 Encounter for screening for malignant neoplasm of prostate: Secondary | ICD-10-CM | POA: Diagnosis not present

## 2022-10-19 DIAGNOSIS — I700 Atherosclerosis of aorta: Secondary | ICD-10-CM

## 2022-10-19 DIAGNOSIS — E782 Mixed hyperlipidemia: Secondary | ICD-10-CM | POA: Diagnosis not present

## 2022-10-19 DIAGNOSIS — I10 Essential (primary) hypertension: Secondary | ICD-10-CM | POA: Diagnosis not present

## 2022-10-19 DIAGNOSIS — E1159 Type 2 diabetes mellitus with other circulatory complications: Secondary | ICD-10-CM | POA: Diagnosis not present

## 2022-10-19 LAB — COMPREHENSIVE METABOLIC PANEL
ALT: 16 U/L (ref 0–53)
AST: 17 U/L (ref 0–37)
Albumin: 4.3 g/dL (ref 3.5–5.2)
Alkaline Phosphatase: 53 U/L (ref 39–117)
BUN: 15 mg/dL (ref 6–23)
CO2: 27 mEq/L (ref 19–32)
Calcium: 8.9 mg/dL (ref 8.4–10.5)
Chloride: 104 mEq/L (ref 96–112)
Creatinine, Ser: 1.01 mg/dL (ref 0.40–1.50)
GFR: 81.71 mL/min (ref >60.00)
Glucose, Bld: 130 mg/dL — ABNORMAL HIGH (ref 70–99)
Potassium: 4.1 mEq/L (ref 3.5–5.1)
Sodium: 140 mEq/L (ref 135–145)
Total Bilirubin: 0.8 mg/dL (ref 0.2–1.2)
Total Protein: 6.6 g/dL (ref 6.0–8.3)

## 2022-10-19 LAB — LIPID PANEL
Cholesterol: 108 mg/dL (ref 0–200)
HDL: 48.5 mg/dL (ref >39.00)
LDL Cholesterol: 36 mg/dL (ref 0–99)
NonHDL: 59.03
Total CHOL/HDL Ratio: 2
Triglycerides: 116 mg/dL (ref 0.0–149.0)
VLDL: 23.2 mg/dL (ref 0.0–40.0)

## 2022-10-19 LAB — PSA: PSA: 0.31 ng/mL (ref 0.10–4.00)

## 2022-10-19 LAB — HEMOGLOBIN A1C: Hgb A1c MFr Bld: 5.5 % (ref 4.6–6.5)

## 2022-10-24 ENCOUNTER — Encounter: Payer: Self-pay | Admitting: Family Medicine

## 2022-10-24 ENCOUNTER — Ambulatory Visit: Payer: Managed Care, Other (non HMO) | Admitting: Family Medicine

## 2022-10-24 VITALS — BP 118/76 | HR 79 | Temp 97.6°F | Ht 66.0 in | Wt 160.0 lb

## 2022-10-24 DIAGNOSIS — E1159 Type 2 diabetes mellitus with other circulatory complications: Secondary | ICD-10-CM

## 2022-10-24 DIAGNOSIS — I10 Essential (primary) hypertension: Secondary | ICD-10-CM

## 2022-10-24 DIAGNOSIS — E782 Mixed hyperlipidemia: Secondary | ICD-10-CM | POA: Diagnosis not present

## 2022-10-24 DIAGNOSIS — Z23 Encounter for immunization: Secondary | ICD-10-CM

## 2022-10-24 MED ORDER — SEMAGLUTIDE (1 MG/DOSE) 4 MG/3ML ~~LOC~~ SOPN: 1.0000 mg | PEN_INJECTOR | SUBCUTANEOUS | 2 refills | Status: DC

## 2022-10-24 NOTE — Patient Instructions (Signed)
Nice to see you. We will increase the ozempic to 1 mg. Please let me know if you have excessive nausea on this.

## 2022-10-24 NOTE — Progress Notes (Signed)
Patient seen along with medical student Zada Zhong.  I personally evaluated this patient along with the student, and verified all aspects of the history, physical exam, and medical decision making as documented by the student.  I agree with the student's documentation and have made all necessary edits.  Treston Coker, MD  

## 2022-10-24 NOTE — Progress Notes (Signed)
Marikay Alar, MD Phone: [phone]  Caleb Warren is a 61 y.o. male who presents today for f/u.  Diabetes: Patient reports that he doesn't check sugars at home, but recently went on a cruise and overindulged on food and beverages. He tries to eat a healthy diet otherwise. He recently realized he no longer likes the taste of beer, and attributes this to the Ozempic and overall weight loss. He reports that he wakes up around 4am to have a bowel movement, and that this is the "new normal" after being on Ozempic. He denies polyuria, polydipsia, and changes in vision. He does report some polyphagia and wonders if this is related to his current Ozempic dose. He is interested in increasing the Ozempic for weight loss.  Hypertension: Patient does not check blood pressures at home. He is compliant with his amlodipine. He denies chest pain, shortness of breath, and edema in his lower extremities.   Hyperlipidemia: Patient states that the Repatha seems to have worked for him. He denies any muscle or stomach aches.   Social History   Tobacco Use  Smoking Status Former   Packs/day: 0.50   Years: 5.00   Total pack years: 2.50   Types: Cigarettes   Quit date: 11/21/1988   Years since quitting: 33.9  Smokeless Tobacco Never    Current Outpatient Medications on File Prior to Visit  Medication Sig Dispense Refill   amLODipine (NORVASC) 10 MG tablet TAKE 1 TABLET BY MOUTH EVERY DAY 90 tablet 0   aspirin 81 MG tablet Take 81 mg by mouth daily.     metFORMIN (GLUCOPHAGE-XR) 500 MG 24 hr tablet TAKE 2 TABLETS BY MOUTH EVERY DAY WITH BREAKFAST 180 tablet 1   omeprazole (PRILOSEC OTC) 20 MG tablet Take 20 mg by mouth every 3 (three) days.     REPATHA 140 MG/ML SOSY INJECT 140 MG UNDER THE SKIN EVERY 14 DAYS 4 mL 3   No current facility-administered medications on file prior to visit.     ROS see history of present illness  Objective  Vitals:   10/24/22 1433  BP: 118/76  Pulse: 79  Temp: 97.6  F (36.4 C)  SpO2: 99%    BP Readings from Last 3 Encounters:  10/24/22 118/76  01/26/22 110/78  09/20/21 125/80   Wt Readings from Last 3 Encounters:  10/24/22 160 lb (72.6 kg)  01/26/22 171 lb (77.6 kg)  09/20/21 187 lb 3.2 oz (84.9 kg)    Physical Exam Constitutional:      Appearance: Normal appearance.  HENT:     Head: Normocephalic and atraumatic.  Cardiovascular:     Rate and Rhythm: Normal rate and regular rhythm.  Pulmonary:     Effort: Pulmonary effort is normal.     Breath sounds: Normal breath sounds.  Skin:    General: Skin is warm and dry.  Neurological:     Mental Status: He is alert.    Diabetic Foot Exam - Simple   Simple Foot Form Diabetic Foot exam was performed with the following findings: Yes 10/24/2022  3:00 PM  Visual Inspection No deformities, no ulcerations, no other skin breakdown bilaterally: Yes Sensation Testing Intact to touch and monofilament testing bilaterally: Yes Pulse Check Posterior Tibialis and Dorsalis pulse intact bilaterally: Yes Comments      Assessment/Plan: Please see individual problem list.  Problem List Items Addressed This Visit       Cardiovascular and Mediastinum   Hypertension - Primary    Well controlled. Continue amlodipine  10 mg daily.         Endocrine   Diabetes mellitus type 2, controlled (HCC)    Well controlled. A1c at 5.5. Since patient's BMI is 25.82, he will increase ozempic from 0.5 mg to 1 mg weekly for weight loss and continue metformin XR 1000 mg daily. Advised patient to call back if he has worsening nausea after increasing his Ozempic. Continue healthy diet.      Relevant Medications   Semaglutide, 1 MG/DOSE, 4 MG/3ML SOPN     Other   Hyperlipidemia    Previously well controlled. Continue repatha.         Return in about 6 months (around 04/25/2023) for DM.   Gae Gallop, Medical Student [facility]

## 2022-10-24 NOTE — Assessment & Plan Note (Signed)
Previously well controlled. Continue repatha.

## 2022-10-24 NOTE — Assessment & Plan Note (Signed)
Well-controlled.  Continue amlodipine 10 mg daily 

## 2022-10-24 NOTE — Assessment & Plan Note (Addendum)
Well controlled. A1c at 5.5. Since patient's BMI is 25.82, he will increase ozempic from 0.5 mg to 1 mg weekly for weight loss and continue metformin XR 1000 mg daily. Advised patient to call back if he has worsening nausea after increasing his Ozempic. Continue healthy diet.

## 2022-10-27 ENCOUNTER — Telehealth: Payer: Self-pay

## 2022-10-27 NOTE — Telephone Encounter (Signed)
Noted  

## 2022-10-27 NOTE — Telephone Encounter (Signed)
Called and informed pt of his approval for Ozempic   Effective dates: 10/26/22-10/26/23

## 2022-11-02 ENCOUNTER — Telehealth: Payer: Self-pay | Admitting: Family Medicine

## 2022-11-02 DIAGNOSIS — E1159 Type 2 diabetes mellitus with other circulatory complications: Secondary | ICD-10-CM

## 2022-11-02 NOTE — Telephone Encounter (Signed)
Pt need a refill and PA for ozempic. The pharmacy stated it was not approved

## 2022-11-04 NOTE — Telephone Encounter (Signed)
Insurance company is needing a authorization for the quantity per the patient.

## 2022-11-09 ENCOUNTER — Encounter: Payer: Self-pay | Admitting: Family Medicine

## 2022-11-11 ENCOUNTER — Other Ambulatory Visit (HOSPITAL_COMMUNITY): Payer: Self-pay

## 2023-01-24 ENCOUNTER — Other Ambulatory Visit: Payer: Self-pay

## 2023-01-24 MED ORDER — PREDNISONE 20 MG PO TABS
20.0000 mg | ORAL_TABLET | Freq: Two times a day (BID) | ORAL | 0 refills | Status: DC
  Filled 2023-01-24: qty 6, 3d supply, fill #0

## 2023-01-24 MED ORDER — BENZONATATE 200 MG PO CAPS
200.0000 mg | ORAL_CAPSULE | Freq: Three times a day (TID) | ORAL | 0 refills | Status: DC | PRN
  Filled 2023-01-24: qty 21, 7d supply, fill #0

## 2023-03-01 ENCOUNTER — Other Ambulatory Visit: Payer: Self-pay | Admitting: Family Medicine

## 2023-03-01 DIAGNOSIS — E1159 Type 2 diabetes mellitus with other circulatory complications: Secondary | ICD-10-CM

## 2023-03-16 ENCOUNTER — Other Ambulatory Visit: Payer: Self-pay | Admitting: Family Medicine

## 2023-03-27 ENCOUNTER — Other Ambulatory Visit: Payer: Self-pay

## 2023-03-27 MED ORDER — CELECOXIB 200 MG PO CAPS
200.0000 mg | ORAL_CAPSULE | Freq: Two times a day (BID) | ORAL | 0 refills | Status: DC
  Filled 2023-03-27: qty 30, 15d supply, fill #0

## 2023-04-25 ENCOUNTER — Ambulatory Visit: Payer: Managed Care, Other (non HMO) | Admitting: Family Medicine

## 2023-04-26 ENCOUNTER — Ambulatory Visit: Payer: Managed Care, Other (non HMO) | Admitting: Family Medicine

## 2023-05-10 ENCOUNTER — Ambulatory Visit: Payer: Managed Care, Other (non HMO) | Admitting: Family Medicine

## 2023-05-10 ENCOUNTER — Other Ambulatory Visit: Payer: Self-pay

## 2023-05-10 VITALS — BP 120/80 | HR 80 | Ht 66.0 in | Wt 159.8 lb

## 2023-05-10 DIAGNOSIS — E1159 Type 2 diabetes mellitus with other circulatory complications: Secondary | ICD-10-CM | POA: Diagnosis not present

## 2023-05-10 DIAGNOSIS — Z7985 Long-term (current) use of injectable non-insulin antidiabetic drugs: Secondary | ICD-10-CM

## 2023-05-10 DIAGNOSIS — Z7984 Long term (current) use of oral hypoglycemic drugs: Secondary | ICD-10-CM

## 2023-05-10 DIAGNOSIS — I10 Essential (primary) hypertension: Secondary | ICD-10-CM

## 2023-05-10 DIAGNOSIS — E782 Mixed hyperlipidemia: Secondary | ICD-10-CM

## 2023-05-10 DIAGNOSIS — Z23 Encounter for immunization: Secondary | ICD-10-CM

## 2023-05-10 LAB — POCT GLYCOSYLATED HEMOGLOBIN (HGB A1C): Hemoglobin A1C: 5.2 % (ref 4.0–5.6)

## 2023-05-10 LAB — MICROALBUMIN / CREATININE URINE RATIO
Creatinine,U: 123.8 mg/dL
Microalb Creat Ratio: 0.6 mg/g (ref 0.0–30.0)
Microalb, Ur: <0.7 mg/dL (ref 0.0–1.9)

## 2023-05-10 MED ORDER — AMLODIPINE BESYLATE 10 MG PO TABS
10.0000 mg | ORAL_TABLET | Freq: Every day | ORAL | 3 refills | Status: DC
Start: 2023-05-10 — End: 2023-06-19
  Filled 2023-05-10 – 2023-05-16 (×3): qty 90, 90d supply, fill #0

## 2023-05-10 MED ORDER — SEMAGLUTIDE (1 MG/DOSE) 4 MG/3ML ~~LOC~~ SOPN
1.0000 mg | PEN_INJECTOR | SUBCUTANEOUS | 2 refills | Status: DC
Start: 2023-05-10 — End: 2023-08-28
  Filled 2023-05-10 (×2): qty 3, 28d supply, fill #0
  Filled 2023-06-23: qty 3, 28d supply, fill #1
  Filled 2023-07-26: qty 3, 28d supply, fill #2

## 2023-05-10 MED ORDER — METFORMIN HCL ER 500 MG PO TB24
1000.0000 mg | ORAL_TABLET | Freq: Every day | ORAL | 3 refills | Status: DC
Start: 2023-05-10 — End: 2023-08-28
  Filled 2023-05-10: qty 180, 90d supply, fill #0

## 2023-05-10 MED ORDER — REPATHA 140 MG/ML ~~LOC~~ SOSY
140.0000 mg | PREFILLED_SYRINGE | SUBCUTANEOUS | 3 refills | Status: DC
Start: 2023-05-10 — End: 2023-08-28
  Filled 2023-05-10 (×2): qty 2, 28d supply, fill #0
  Filled 2023-06-23: qty 2, 28d supply, fill #1
  Filled 2023-07-26: qty 2, 28d supply, fill #2

## 2023-05-10 NOTE — Assessment & Plan Note (Signed)
Chronic issue.  Check A1c.  Continue Ozempic 1 mg weekly and metformin XR 1000 mg daily.

## 2023-05-10 NOTE — Assessment & Plan Note (Signed)
Chronic issue.  Previously well-controlled.  Continue Repatha milligrams every 14 days.

## 2023-05-10 NOTE — Patient Instructions (Signed)
Nice to see you. Please try to check your blood pressure at home periodically and if it is running greater than 130/80 consistently please let us know.

## 2023-05-10 NOTE — Assessment & Plan Note (Signed)
Chronic issue.  Decent control.  He will continue amlodipine 10 mg daily.  Encouraged him to start checking his blood pressure at home.

## 2023-05-10 NOTE — Progress Notes (Signed)
Marikay Alar, MD Phone: [phone]  Caleb Warren is a 62 y.o. male who presents today for f/u.  DIABETES Disease Monitoring: Blood Sugar ranges-not checking Polyuria/phagia/dipsia- no      Optho- UTD Medications: Compliance- taking metformin, ozempic Hypoglycemic symptoms- no  HYPERTENSION Disease Monitoring Home BP Monitoring not checking Chest pain- no    Dyspnea- no Medications Compliance-  taking amlodipine, though forgot to take it this morning. Lightheadedness-  no  Edema- no BMET    Component Value Date/Time   NA 140 10/19/2022 0731   NA 142 02/13/2017 0921   K 4.1 10/19/2022 0731   CL 104 10/19/2022 0731   CO2 27 10/19/2022 0731   GLUCOSE 130 (H) 10/19/2022 0731   BUN 15 10/19/2022 0731   BUN 14 02/13/2017 0921   CREATININE 1.01 10/19/2022 0731   CALCIUM 8.9 10/19/2022 0731   GFRNONAA >60 02/27/2017 1300   GFRAA >60 02/27/2017 1300   HYPERLIPIDEMIA Symptoms Chest pain on exertion:  no   Medications: Compliance- taking repatha Right upper quadrant pain- no  Muscle aches- no Lipid Panel     Component Value Date/Time   CHOL 108 10/19/2022 0731   CHOL 191 11/08/2017 0800   TRIG 116.0 10/19/2022 0731   HDL 48.50 10/19/2022 0731   HDL 47 11/08/2017 0800   CHOLHDL 2 10/19/2022 0731   VLDL 23.2 10/19/2022 0731   LDLCALC 36 10/19/2022 0731   LDLCALC 106 (H) 11/08/2017 0800   LDLDIRECT 62.0 09/20/2021 0942   LABVLDL 38 11/08/2017 0800      Social History   Tobacco Use  Smoking Status Former   Packs/day: 0.50   Years: 5.00   Additional pack years: 0.00   Total pack years: 2.50   Types: Cigarettes   Quit date: 11/21/1988   Years since quitting: 34.4  Smokeless Tobacco Never    Current Outpatient Medications on File Prior to Visit  Medication Sig Dispense Refill   aspirin 81 MG tablet Take 81 mg by mouth daily.     celecoxib (CELEBREX) 200 MG capsule Take 1 capsule (200 mg total) by mouth 2 (two) times daily. 30 capsule 0   omeprazole  (PRILOSEC OTC) 20 MG tablet Take 20 mg by mouth every 3 (three) days.     predniSONE (DELTASONE) 20 MG tablet Take 1 tablet (20 mg total) by mouth 2 (two) times daily for 3 days. 6 tablet 0   No current facility-administered medications on file prior to visit.     ROS see history of present illness  Objective  Physical Exam Vitals:   05/10/23 1337 05/10/23 1353  BP: 120/84 120/80  Pulse: 80   SpO2: 97%     BP Readings from Last 3 Encounters:  05/10/23 120/80  10/24/22 118/76  01/26/22 110/78   Wt Readings from Last 3 Encounters:  05/10/23 159 lb 12.8 oz (72.5 kg)  10/24/22 160 lb (72.6 kg)  01/26/22 171 lb (77.6 kg)    Physical Exam Constitutional:      General: He is not in acute distress.    Appearance: He is not diaphoretic.  Cardiovascular:     Rate and Rhythm: Normal rate and regular rhythm.     Heart sounds: Normal heart sounds.  Pulmonary:     Effort: Pulmonary effort is normal.     Breath sounds: Normal breath sounds.  Skin:    General: Skin is warm and dry.  Neurological:     Mental Status: He is alert.      Assessment/Plan: Please  see individual problem list.  Controlled type 2 diabetes mellitus with other circulatory complication, without long-term current use of insulin (HCC) Assessment & Plan: Chronic issue.  Check A1c.  Continue Ozempic 1 mg weekly and metformin XR 1000 mg daily.  Orders: -     Microalbumin / creatinine urine ratio -     metFORMIN HCl ER; Take 2 tablets (1,000 mg total) by mouth daily with breakfast.  Dispense: 180 tablet; Refill: 3 -     Semaglutide (1 MG/DOSE); Inject 1 mg as directed once a week.  Dispense: 9 mL; Refill: 2 -     POCT glycosylated hemoglobin (Hb A1C)  Primary hypertension Assessment & Plan: Chronic issue.  Decent control.  He will continue amlodipine 10 mg daily.  Encouraged him to start checking his blood pressure at home.  Orders: -     amLODIPine Besylate; Take 1 tablet (10 mg total) by mouth daily.   Dispense: 90 tablet; Refill: 3  Mixed hyperlipidemia Assessment & Plan: Chronic issue.  Previously well-controlled.  Continue Repatha milligrams every 14 days.  Orders: -     Repatha; Inject 140 mg into the skin every 14 (fourteen) days.  Dispense: 4 mL; Refill: 3  Encounter for immunization -     Varicella-zoster vaccine IM     Health Maintenance: shingrix vaccine given today.   Return in about 6 months (around 11/09/2023) for f/u and shingrix at same visit.   Marikay Alar, MD [facility]

## 2023-05-11 ENCOUNTER — Other Ambulatory Visit: Payer: Self-pay | Admitting: Family Medicine

## 2023-05-11 DIAGNOSIS — E782 Mixed hyperlipidemia: Secondary | ICD-10-CM

## 2023-05-16 ENCOUNTER — Other Ambulatory Visit: Payer: Self-pay

## 2023-05-22 ENCOUNTER — Other Ambulatory Visit: Payer: Self-pay

## 2023-05-29 ENCOUNTER — Other Ambulatory Visit: Payer: Self-pay

## 2023-05-31 ENCOUNTER — Other Ambulatory Visit: Payer: Self-pay

## 2023-06-05 ENCOUNTER — Other Ambulatory Visit: Payer: Self-pay

## 2023-06-05 MED ORDER — DOXYCYCLINE MONOHYDRATE 100 MG PO CAPS
100.0000 mg | ORAL_CAPSULE | Freq: Two times a day (BID) | ORAL | 0 refills | Status: DC
  Filled 2023-06-05: qty 14, 7d supply, fill #0

## 2023-06-13 ENCOUNTER — Other Ambulatory Visit: Payer: Self-pay

## 2023-06-13 MED ORDER — PREDNISONE 10 MG PO TABS
ORAL_TABLET | ORAL | 0 refills | Status: DC
  Filled 2023-06-13: qty 29, 9d supply, fill #0

## 2023-06-13 MED ORDER — MONTELUKAST SODIUM 10 MG PO TABS
10.0000 mg | ORAL_TABLET | Freq: Every day | ORAL | 0 refills | Status: DC
  Filled 2023-06-13: qty 30, 30d supply, fill #0

## 2023-06-17 ENCOUNTER — Other Ambulatory Visit: Payer: Self-pay | Admitting: Family Medicine

## 2023-06-17 DIAGNOSIS — I10 Essential (primary) hypertension: Secondary | ICD-10-CM

## 2023-07-19 ENCOUNTER — Telehealth: Payer: Self-pay | Admitting: Family Medicine

## 2023-07-19 NOTE — Telephone Encounter (Signed)
Filled out my portion and placed in Dr. Purvis Sheffield needs to be signed basket.

## 2023-07-19 NOTE — Telephone Encounter (Signed)
Patient dropped off document  [employer] Form , to be filled out by provider. Patient requested to send it back via Call Patient to pick up within 5-days. Document is located in providers tray at front office.Please advise at [facility] [phone]

## 2023-07-25 NOTE — Telephone Encounter (Signed)
Form reviewed and signed. Please make copies and scan to the chart. Make original available to patient to pick up.

## 2023-07-25 NOTE — Telephone Encounter (Signed)
Forms put up front in the Patient pick up folder.

## 2023-08-28 ENCOUNTER — Other Ambulatory Visit: Payer: Self-pay

## 2023-08-28 ENCOUNTER — Telehealth: Payer: Self-pay

## 2023-08-28 DIAGNOSIS — E782 Mixed hyperlipidemia: Secondary | ICD-10-CM

## 2023-08-28 DIAGNOSIS — E1159 Type 2 diabetes mellitus with other circulatory complications: Secondary | ICD-10-CM

## 2023-08-28 DIAGNOSIS — I10 Essential (primary) hypertension: Secondary | ICD-10-CM

## 2023-08-28 MED ORDER — METFORMIN HCL ER 500 MG PO TB24
1000.0000 mg | ORAL_TABLET | Freq: Every day | ORAL | 3 refills | Status: DC
Start: 2023-08-28 — End: 2024-07-15

## 2023-08-28 MED ORDER — REPATHA 140 MG/ML ~~LOC~~ SOSY
140.0000 mg | PREFILLED_SYRINGE | SUBCUTANEOUS | 3 refills | Status: DC
Start: 2023-08-28 — End: 2024-06-26

## 2023-08-28 MED ORDER — AMLODIPINE BESYLATE 10 MG PO TABS
10.0000 mg | ORAL_TABLET | Freq: Every day | ORAL | 3 refills | Status: DC
Start: 2023-08-28 — End: 2024-07-15

## 2023-08-28 MED ORDER — SEMAGLUTIDE (1 MG/DOSE) 4 MG/3ML ~~LOC~~ SOPN
1.0000 mg | PEN_INJECTOR | SUBCUTANEOUS | 2 refills | Status: DC
Start: 2023-08-28 — End: 2024-05-14

## 2023-08-28 NOTE — Telephone Encounter (Signed)
Prescription Request  08/28/2023  LOV: Visit date not found  What is the name of the medication or equipment? metFORMIN (GLUCOPHAGE-XR) 500 MG 24 hr tablet, amLODipine (NORVASC) 10 MG tablet, Evolocumab (REPATHA) 140 MG/ML SOSY and Semaglutide, 1 MG/DOSE, 4 MG/3ML SOPN  Have you contacted your pharmacy to request a refill? No   Which pharmacy would you like this sent to?   [facility] [address], [place] - [address] [place] [address] Phone: [phone] Fax: [phone]    Patient notified that their request is being sent to the clinical staff for review and that they should receive a response within 2 business days.   Please advise at Mobile [phone] (mobile)  Patient tried to request refills via MyChart, but was unable to select [facility] on [address]  Patient states his metformin and amlodipine is usually a 90-day supply.  Patient states he has changed his pharmacy from the [facility] to [facility] on [address] because his insurance is no longer accepted at the [employer].  Patient states he is out of his Semaglutide.  Patient states he is leaving town this Thursday evening, and will need to take his shot on Saturday.

## 2023-08-28 NOTE — Telephone Encounter (Signed)
Prescriptions sent to Pharmacy.

## 2023-09-28 ENCOUNTER — Other Ambulatory Visit (HOSPITAL_COMMUNITY): Payer: Self-pay

## 2023-09-28 ENCOUNTER — Telehealth: Payer: Self-pay | Admitting: Pharmacist

## 2023-09-28 NOTE — Telephone Encounter (Signed)
Pharmacy Patient Advocate Encounter   Received notification from CoverMyMeds that prior authorization for Ozempic (1 MG/DOSE) 4MG /3ML pen-injectors is required/requested.   Insurance verification completed.   The patient is insured through [employer] .   Per test claim: PA required; PA submitted to above mentioned insurance via CoverMyMeds Key/confirmation #/EOC [id number] Status is pending

## 2023-10-02 NOTE — Telephone Encounter (Signed)
Pharmacy Patient Advocate Encounter  Received notification from [employer] that Prior Authorization for Ozempic (1 MG/DOSE) 4MG /3ML pen-injectors has been APPROVED from 09/28/2023 to 09/27/2024   PA #/Case ID/Reference #: [id number]

## 2023-10-18 ENCOUNTER — Encounter: Payer: Self-pay | Admitting: Family Medicine

## 2023-10-18 NOTE — Telephone Encounter (Signed)
Care team updated and letter sent for eye exam notes.

## 2023-11-10 ENCOUNTER — Encounter: Payer: Self-pay | Admitting: Family Medicine

## 2023-11-10 ENCOUNTER — Ambulatory Visit: Payer: Managed Care, Other (non HMO) | Admitting: Family Medicine

## 2023-11-10 ENCOUNTER — Telehealth: Payer: Self-pay

## 2023-11-10 VITALS — BP 126/78 | HR 74 | Temp 98.4°F | Ht 66.0 in | Wt 160.4 lb

## 2023-11-10 DIAGNOSIS — E1159 Type 2 diabetes mellitus with other circulatory complications: Secondary | ICD-10-CM | POA: Diagnosis not present

## 2023-11-10 DIAGNOSIS — M25512 Pain in left shoulder: Secondary | ICD-10-CM | POA: Diagnosis not present

## 2023-11-10 DIAGNOSIS — Z7984 Long term (current) use of oral hypoglycemic drugs: Secondary | ICD-10-CM

## 2023-11-10 DIAGNOSIS — Z125 Encounter for screening for malignant neoplasm of prostate: Secondary | ICD-10-CM

## 2023-11-10 DIAGNOSIS — Z23 Encounter for immunization: Secondary | ICD-10-CM

## 2023-11-10 DIAGNOSIS — Z7985 Long-term (current) use of injectable non-insulin antidiabetic drugs: Secondary | ICD-10-CM

## 2023-11-10 DIAGNOSIS — I10 Essential (primary) hypertension: Secondary | ICD-10-CM | POA: Diagnosis not present

## 2023-11-10 DIAGNOSIS — G8929 Other chronic pain: Secondary | ICD-10-CM

## 2023-11-10 NOTE — Assessment & Plan Note (Signed)
Chronic issue.  Check A1c.  Continue Ozempic 1 mg weekly and metformin XR 1000 mg daily.

## 2023-11-10 NOTE — Assessment & Plan Note (Signed)
Possibly rotator cuff impingement versus bursitis.  Patient will contact his orthopedist for further evaluation.

## 2023-11-10 NOTE — Telephone Encounter (Signed)
Patient states he would like to transfer his care to Dr. Dale Eudora from Dr. Marikay Alar.  Patient states his wife, Amere Westrope, is already a patient of Dr. Lorin Picket.  I let patient know that Dr. Lorin Picket is not accepting new patients at this time, but I will send her a message to see if she is willing to make an exception.

## 2023-11-10 NOTE — Assessment & Plan Note (Signed)
 Chronic issue.  Adequately controlled.  Patient will continue amlodipine 10 mg daily.

## 2023-11-10 NOTE — Progress Notes (Signed)
Marikay Alar, MD Phone: [phone]  Caleb Warren is a 62 y.o. male who presents today for f/u.  HYPERTENSION Disease Monitoring Home BP Monitoring not checking Chest pain- no    Dyspnea- no Medications Compliance-  taking amlodipine.  Edema- no BMET    Component Value Date/Time   NA 140 10/19/2022 0731   NA 142 02/13/2017 0921   K 4.1 10/19/2022 0731   CL 104 10/19/2022 0731   CO2 27 10/19/2022 0731   GLUCOSE 130 (H) 10/19/2022 0731   BUN 15 10/19/2022 0731   BUN 14 02/13/2017 0921   CREATININE 1.01 10/19/2022 0731   CALCIUM 8.9 10/19/2022 0731   GFRNONAA >60 02/27/2017 1300   GFRAA >60 02/27/2017 1300   DIABETES Disease Monitoring: Blood Sugar ranges-not checking Polyuria/phagia/dipsia- no      Optho- UTD Medications: Compliance- taking metformin, ozempic Hypoglycemic symptoms- no  Left shoulder pain: Patient has this has been bothering him for a few months.  He was seen by orthopedics previously for this and had an injection in it.  Notes ibuprofen helps some.  Notes discomfort on abduction and sometimes at night.   Social History   Tobacco Use  Smoking Status Former   Current packs/day: 0.00   Average packs/day: 0.5 packs/day for 5.0 years (2.5 ttl pk-yrs)   Types: Cigarettes   Start date: 11/22/1983   Quit date: 11/21/1988   Years since quitting: 34.9  Smokeless Tobacco Never    Current Outpatient Medications on File Prior to Visit  Medication Sig Dispense Refill   amLODipine (NORVASC) 10 MG tablet Take 1 tablet (10 mg total) by mouth daily. 90 tablet 3   aspirin 81 MG tablet Take 81 mg by mouth daily.     Evolocumab (REPATHA) 140 MG/ML SOSY Inject 140 mg into the skin every 14 (fourteen) days. 4 mL 3   metFORMIN (GLUCOPHAGE-XR) 500 MG 24 hr tablet Take 2 tablets (1,000 mg total) by mouth daily with breakfast. 180 tablet 3   omeprazole (PRILOSEC OTC) 20 MG tablet Take 20 mg by mouth every 3 (three) days.     predniSONE (DELTASONE) 20 MG tablet Take 1  tablet (20 mg total) by mouth 2 (two) times daily for 3 days. 6 tablet 0   Semaglutide, 1 MG/DOSE, 4 MG/3ML SOPN Inject 1 mg as directed once a week. 9 mL 2   No current facility-administered medications on file prior to visit.     ROS see history of present illness  Objective  Physical Exam Vitals:   11/10/23 1537  BP: 126/78  Pulse: 74  Temp: 98.4 F (36.9 C)  SpO2: 96%    BP Readings from Last 3 Encounters:  11/10/23 126/78  05/10/23 120/80  10/24/22 118/76   Wt Readings from Last 3 Encounters:  11/10/23 160 lb 6.4 oz (72.8 kg)  05/10/23 159 lb 12.8 oz (72.5 kg)  10/24/22 160 lb (72.6 kg)    Physical Exam Constitutional:      General: He is not in acute distress.    Appearance: He is not diaphoretic.  Cardiovascular:     Rate and Rhythm: Normal rate and regular rhythm.     Heart sounds: Normal heart sounds.  Pulmonary:     Effort: Pulmonary effort is normal.     Breath sounds: Normal breath sounds.  Musculoskeletal:     Comments: Nontender left shoulder, he has good passive range of motion, does have discomfort on passive external range of motion and abduction as well as active abduction, negative  empty can, negative speeds  Skin:    General: Skin is warm and dry.  Neurological:     Mental Status: He is alert.    Diabetic Foot Exam - Simple   Simple Foot Form Diabetic Foot exam was performed with the following findings: Yes 11/10/2023  3:49 PM  Visual Inspection No deformities, no ulcerations, no other skin breakdown bilaterally: Yes Sensation Testing Intact to touch and monofilament testing bilaterally: Yes Pulse Check Posterior Tibialis and Dorsalis pulse intact bilaterally: Yes Comments      Assessment/Plan: Please see individual problem list.  Primary hypertension Assessment & Plan: Chronic issue.  Adequately controlled.  Patient will continue amlodipine 10 mg daily.  Orders: -     Comprehensive metabolic panel -     Lipid  panel  Controlled type 2 diabetes mellitus with other circulatory complication, without long-term current use of insulin (HCC) Assessment & Plan: Chronic issue.  Check A1c.  Continue Ozempic 1 mg weekly and metformin XR 1000 mg daily.  Orders: -     Comprehensive metabolic panel -     Hemoglobin A1c -     Lipid panel  Chronic left shoulder pain Assessment & Plan: Possibly rotator cuff impingement versus bursitis.  Patient will contact his orthopedist for further evaluation.   Prostate cancer screening -     PSA  Encounter for administration of vaccine -     Varicella-zoster vaccine IM     Health Maintenance: Shingrix vaccine given today.  Return in about 6 months (around 05/10/2024) for Transfer of care.   Marikay Alar, MD [facility]

## 2023-11-11 LAB — LIPID PANEL
Cholesterol: 132 mg/dL (ref <200)
HDL: 57 mg/dL (ref >=40)
LDL Cholesterol (Calc): 50 mg/dL
Non-HDL Cholesterol (Calc): 75 mg/dL (ref <130)
Total CHOL/HDL Ratio: 2.3 (calc) (ref <5.0)
Triglycerides: 168 mg/dL — ABNORMAL HIGH (ref <150)

## 2023-11-11 LAB — COMPREHENSIVE METABOLIC PANEL
AG Ratio: 1.8 (calc) (ref 1.0–2.5)
ALT: 18 U/L (ref 9–46)
AST: 20 U/L (ref 10–35)
Albumin: 4.6 g/dL (ref 3.6–5.1)
Alkaline phosphatase (APISO): 53 U/L (ref 35–144)
BUN: 16 mg/dL (ref 7–25)
CO2: 23 mmol/L (ref 20–32)
Calcium: 9.5 mg/dL (ref 8.6–10.3)
Chloride: 107 mmol/L (ref 98–110)
Creat: 1.02 mg/dL (ref 0.70–1.35)
Globulin: 2.6 g/dL (ref 1.9–3.7)
Glucose, Bld: 64 mg/dL — ABNORMAL LOW (ref 65–99)
Potassium: 4.2 mmol/L (ref 3.5–5.3)
Sodium: 141 mmol/L (ref 135–146)
Total Bilirubin: 0.7 mg/dL (ref 0.2–1.2)
Total Protein: 7.2 g/dL (ref 6.1–8.1)

## 2023-11-11 LAB — HEMOGLOBIN A1C
Hgb A1c MFr Bld: 5.4 %{Hb} (ref <5.7)
Mean Plasma Glucose: 108 mg/dL
eAG (mmol/L): 6 mmol/L

## 2023-11-11 LAB — PSA: PSA: 0.38 ng/mL (ref <=4.00)

## 2023-11-11 NOTE — Telephone Encounter (Signed)
Ok to schedule TOC appt.

## 2023-11-13 ENCOUNTER — Telehealth: Payer: Self-pay

## 2023-11-13 NOTE — Telephone Encounter (Signed)
I left voicemail for patient asking him to please call us back to schedule an appointment with Dr. Dale [place].  When patient calls back, we need to schedule a transfer of care visit for him with Dr. Lorin Picket.  This is ok per Dr. Dale Wenden.

## 2023-11-13 NOTE — Telephone Encounter (Signed)
Noted  

## 2024-03-01 ENCOUNTER — Telehealth: Admitting: Physician Assistant

## 2024-03-01 DIAGNOSIS — J019 Acute sinusitis, unspecified: Secondary | ICD-10-CM | POA: Diagnosis not present

## 2024-03-01 DIAGNOSIS — B9689 Other specified bacterial agents as the cause of diseases classified elsewhere: Secondary | ICD-10-CM | POA: Diagnosis not present

## 2024-03-01 MED ORDER — AMOXICILLIN-POT CLAVULANATE 875-125 MG PO TABS: 1.0000 | ORAL_TABLET | Freq: Two times a day (BID) | ORAL | 0 refills | Status: DC

## 2024-03-01 NOTE — Progress Notes (Signed)

## 2024-04-25 ENCOUNTER — Encounter: Admitting: Internal Medicine

## 2024-05-14 ENCOUNTER — Other Ambulatory Visit: Payer: Self-pay | Admitting: *Deleted

## 2024-05-14 DIAGNOSIS — E1159 Type 2 diabetes mellitus with other circulatory complications: Secondary | ICD-10-CM

## 2024-05-14 MED ORDER — SEMAGLUTIDE (1 MG/DOSE) 4 MG/3ML ~~LOC~~ SOPN: 1.0000 mg | PEN_INJECTOR | SUBCUTANEOUS | 0 refills | Status: DC

## 2024-05-22 ENCOUNTER — Encounter: Admitting: Internal Medicine

## 2024-06-24 ENCOUNTER — Encounter: Admitting: Internal Medicine

## 2024-06-26 ENCOUNTER — Other Ambulatory Visit: Payer: Self-pay

## 2024-06-26 DIAGNOSIS — E782 Mixed hyperlipidemia: Secondary | ICD-10-CM

## 2024-06-26 MED ORDER — REPATHA 140 MG/ML ~~LOC~~ SOSY
140.0000 mg | PREFILLED_SYRINGE | SUBCUTANEOUS | 0 refills | Status: DC
Start: 2024-06-26 — End: 2024-07-15

## 2024-07-16 ENCOUNTER — Ambulatory Visit: Admitting: Internal Medicine

## 2024-07-16 ENCOUNTER — Ambulatory Visit: Payer: Self-pay | Admitting: Internal Medicine

## 2024-07-16 VITALS — BP 116/70 | HR 70 | Resp 16 | Ht 66.0 in | Wt 158.2 lb

## 2024-07-16 DIAGNOSIS — E782 Mixed hyperlipidemia: Secondary | ICD-10-CM

## 2024-07-16 DIAGNOSIS — I700 Atherosclerosis of aorta: Secondary | ICD-10-CM

## 2024-07-16 DIAGNOSIS — Z1211 Encounter for screening for malignant neoplasm of colon: Secondary | ICD-10-CM

## 2024-07-16 DIAGNOSIS — I10 Essential (primary) hypertension: Secondary | ICD-10-CM | POA: Diagnosis not present

## 2024-07-16 DIAGNOSIS — E1159 Type 2 diabetes mellitus with other circulatory complications: Secondary | ICD-10-CM

## 2024-07-16 DIAGNOSIS — Z7984 Long term (current) use of oral hypoglycemic drugs: Secondary | ICD-10-CM

## 2024-07-16 DIAGNOSIS — Z7985 Long-term (current) use of injectable non-insulin antidiabetic drugs: Secondary | ICD-10-CM

## 2024-07-16 LAB — CBC WITH DIFFERENTIAL/PLATELET
Basophils Absolute: 0 K/uL (ref 0.0–0.1)
Basophils Relative: 0.6 % (ref 0.0–3.0)
Eosinophils Absolute: 0.1 K/uL (ref 0.0–0.7)
Eosinophils Relative: 2.1 % (ref 0.0–5.0)
HCT: 46.2 % (ref 39.0–52.0)
Hemoglobin: 15.2 g/dL (ref 13.0–17.0)
Lymphocytes Relative: 23.2 % (ref 12.0–46.0)
Lymphs Abs: 1.6 K/uL (ref 0.7–4.0)
MCHC: 32.9 g/dL (ref 30.0–36.0)
MCV: 92.9 fl (ref 78.0–100.0)
Monocytes Absolute: 0.8 K/uL (ref 0.1–1.0)
Monocytes Relative: 11 % (ref 3.0–12.0)
Neutro Abs: 4.4 K/uL (ref 1.4–7.7)
Neutrophils Relative %: 63.1 % (ref 43.0–77.0)
Platelets: 199 K/uL (ref 150.0–400.0)
RBC: 4.97 Mil/uL (ref 4.22–5.81)
RDW: 13.3 % (ref 11.5–15.5)
WBC: 7 K/uL (ref 4.0–10.5)

## 2024-07-16 LAB — LIPID PANEL
Cholesterol: 121 mg/dL (ref 0–200)
HDL: 52.4 mg/dL (ref >39.00)
LDL Cholesterol: 45 mg/dL (ref 0–99)
NonHDL: 68.91
Total CHOL/HDL Ratio: 2
Triglycerides: 121 mg/dL (ref 0.0–149.0)
VLDL: 24.2 mg/dL (ref 0.0–40.0)

## 2024-07-16 LAB — HEPATIC FUNCTION PANEL
ALT: 16 U/L (ref 0–53)
AST: 17 U/L (ref 0–37)
Albumin: 4.4 g/dL (ref 3.5–5.2)
Alkaline Phosphatase: 55 U/L (ref 39–117)
Bilirubin, Direct: 0.1 mg/dL (ref 0.0–0.3)
Total Bilirubin: 0.5 mg/dL (ref 0.2–1.2)
Total Protein: 6.7 g/dL (ref 6.0–8.3)

## 2024-07-16 LAB — MICROALBUMIN / CREATININE URINE RATIO
Creatinine,U: 127.9 mg/dL
Microalb Creat Ratio: 9.8 mg/g (ref 0.0–30.0)
Microalb, Ur: 1.3 mg/dL (ref 0.0–1.9)

## 2024-07-16 LAB — BASIC METABOLIC PANEL WITH GFR
BUN: 14 mg/dL (ref 6–23)
CO2: 28 meq/L (ref 19–32)
Calcium: 9.1 mg/dL (ref 8.4–10.5)
Chloride: 106 meq/L (ref 96–112)
Creatinine, Ser: 0.95 mg/dL (ref 0.40–1.50)
GFR: 86.87 mL/min (ref >60.00)
Glucose, Bld: 93 mg/dL (ref 70–99)
Potassium: 4.4 meq/L (ref 3.5–5.1)
Sodium: 143 meq/L (ref 135–145)

## 2024-07-16 LAB — TSH: TSH: 1.58 u[IU]/mL (ref 0.35–5.50)

## 2024-07-16 LAB — HEMOGLOBIN A1C: Hgb A1c MFr Bld: 5.8 % (ref 4.6–6.5)

## 2024-07-16 MED ORDER — AMLODIPINE BESYLATE 10 MG PO TABS: 10.0000 mg | ORAL_TABLET | Freq: Every day | ORAL | 3 refills | Status: AC

## 2024-07-16 MED ORDER — METFORMIN HCL ER 500 MG PO TB24: 1000.0000 mg | ORAL_TABLET | Freq: Every day | ORAL | 3 refills | Status: AC

## 2024-07-16 MED ORDER — REPATHA 140 MG/ML ~~LOC~~ SOSY: 140.0000 mg | PREFILLED_SYRINGE | SUBCUTANEOUS | 0 refills | Status: AC

## 2024-07-16 MED ORDER — SEMAGLUTIDE (1 MG/DOSE) 4 MG/3ML ~~LOC~~ SOPN: 1.0000 mg | PEN_INJECTOR | SUBCUTANEOUS | 0 refills | Status: DC

## 2024-07-16 NOTE — Progress Notes (Addendum)
 Subjective:    Patient ID: Caleb Warren, male    DOB: 01/21/1961, 63 y.o.   MRN: [id number]  Patient here for  Chief Complaint  Patient presents with   Transitions Of Care    HPI Here to establish care. Former pt of Dr Maribeth. Has a history of hypertension and diabetes. Has been on ozempic  and metformin  for his diabetes. Sugars well controlled. Blood pressure doing well. He stays active. No chest pain or sob reported. No abdominal pain or bowel change reported. Due colonoscopy this year.    Past Medical History:  Diagnosis Date   Arthritis    HANDS   [facility]thritis of [facility]) joint of left thumb 06/27/2017   Chicken pox    Diabetes (HCC)    Difficult intubation    Diverticulitis    GERD (gastroesophageal reflux disease)    History of kidney stones    Hyperlipidemia    Hypertension    Nephrolithiasis 02/24/2017   Past Surgical History:  Procedure Laterality Date   COLONOSCOPY  2016   CYSTOSCOPY WITH STENT PLACEMENT Right 02/24/2017   Procedure: CYSTOSCOPY WITH STENT PLACEMENT;  Surgeon: Redell Lynwood Napoleon, MD;  Location: [facility] ORS;  Service: Urology;  Laterality: Right;   IR NEPHROSTOMY PLACEMENT RIGHT  02/23/2017   KIDNEY STONE SURGERY  1994   NEPHROLITHOTOMY Right 02/24/2017   Procedure: NEPHROLITHOTOMY PERCUTANEOUS;  Surgeon: Redell Lynwood Napoleon, MD;  Location: [facility];  Service: Urology;  Laterality: Right;   Family History  Problem Relation Age of Onset   Hyperlipidemia Mother    Hypertension Mother    Hyperlipidemia Father    Hypertension Father    Arthritis Maternal Grandmother    Cancer Maternal Grandmother        breast   Arthritis Maternal Grandfather    Cancer Paternal Uncle        colon   Prostate cancer Neg Hx    Kidney cancer Neg Hx    Bladder Cancer Neg Hx    Social History   Socioeconomic History   Marital status: Married    Spouse name: Not on file   Number of children: Not on file   Years of education: Not on file   Highest education  level: Bachelor's degree (e.g., BA, AB, BS)  Occupational History   Not on file  Tobacco Use   Smoking status: Former    Current packs/day: 0.00    Average packs/day: 0.5 packs/day for 5.0 years (2.5 ttl pk-yrs)    Types: Cigarettes    Start date: 11/22/1983    Quit date: 11/21/1988    Years since quitting: 35.6   Smokeless tobacco: Never  Substance and Sexual Activity   Alcohol use: Yes    Comment: occ   Drug use: No   Sexual activity: Not on file  Other Topics Concern   Not on file  Social History Narrative   Lives in [place]. No pets. Works for [employer].      Diet - regular diet, limited red meat   Exercise - walks   Social Drivers of Health   Financial Resource Strain: Low Risk  (05/10/2023)   Overall Financial Resource Strain (CARDIA)    Difficulty of Paying Living Expenses: Not hard at all  Food Insecurity: No Food Insecurity (05/10/2023)   Hunger Vital Sign    Worried About Running Out of Food in the Last Year: Never true    Ran Out of Food in the Last Year: Never true  Transportation Needs:  No Transportation Needs (05/10/2023)   PRAPARE - Administrator, Civil Service (Medical): No    Lack of Transportation (Non-Medical): No  Physical Activity: Insufficiently Active (05/10/2023)   Exercise Vital Sign    Days of Exercise per Week: 3 days    Minutes of Exercise per Session: 20 min  Stress: No Stress Concern Present (05/10/2023)   [employer] of Occupational Health - Occupational Stress Questionnaire    Feeling of Stress : Not at all  Social Connections: Unknown (05/10/2023)   Social Connection and Isolation Panel    Frequency of Communication with Friends and Family: Once a week    Frequency of Social Gatherings with Friends and Family: Once a week    Attends Religious Services: Patient declined    Database administrator or Organizations: No    Attends Engineer, structural: Not on file    Marital Status: Married     Review of  Systems  Constitutional:  Negative for appetite change and unexpected weight change.  HENT:  Negative for congestion and sinus pressure.   Respiratory:  Negative for cough, chest tightness and shortness of breath.   Cardiovascular:  Negative for chest pain, palpitations and leg swelling.  Gastrointestinal:  Negative for abdominal pain, diarrhea, nausea and vomiting.  Genitourinary:  Negative for difficulty urinating and dysuria.  Musculoskeletal:  Negative for joint swelling and myalgias.  Skin:  Negative for color change and rash.  Neurological:  Negative for dizziness and headaches.  Psychiatric/Behavioral:  Negative for agitation and dysphoric mood.        Objective:     BP 116/70   Pulse 70   Resp 16   Ht 5' 6 (1.676 m)   Wt 158 lb 3.2 oz (71.8 kg)   SpO2 99%   BMI 25.53 kg/m  Wt Readings from Last 3 Encounters:  07/16/24 158 lb 3.2 oz (71.8 kg)  11/10/23 160 lb 6.4 oz (72.8 kg)  05/10/23 159 lb 12.8 oz (72.5 kg)    Physical Exam Vitals reviewed.  Constitutional:      General: He is not in acute distress.    Appearance: Normal appearance. He is well-developed.  HENT:     Head: Normocephalic and atraumatic.     Right Ear: External ear normal.     Left Ear: External ear normal.     Mouth/Throat:     Pharynx: No oropharyngeal exudate or posterior oropharyngeal erythema.  Eyes:     General: No scleral icterus.       Right eye: No discharge.        Left eye: No discharge.     Conjunctiva/sclera: Conjunctivae normal.  Cardiovascular:     Rate and Rhythm: Normal rate and regular rhythm.  Pulmonary:     Effort: Pulmonary effort is normal. No respiratory distress.     Breath sounds: Normal breath sounds.  Abdominal:     General: Bowel sounds are normal.     Palpations: Abdomen is soft.     Tenderness: There is no abdominal tenderness.  Musculoskeletal:        General: No swelling or tenderness.     Cervical back: Neck supple. No tenderness.  Lymphadenopathy:      Cervical: No cervical adenopathy.  Skin:    Findings: No erythema or rash.  Neurological:     Mental Status: He is alert.  Psychiatric:        Mood and Affect: Mood normal.  Behavior: Behavior normal.         Outpatient Encounter Medications as of 07/16/2024  Medication Sig   amLODipine  (NORVASC ) 10 MG tablet Take 1 tablet (10 mg total) by mouth daily.   aspirin 81 MG tablet Take 81 mg by mouth daily.   Evolocumab  (REPATHA ) 140 MG/ML SOSY Inject 140 mg into the skin every 14 (fourteen) days.   metFORMIN  (GLUCOPHAGE -XR) 500 MG 24 hr tablet Take 2 tablets (1,000 mg total) by mouth daily with breakfast.   omeprazole  (PRILOSEC  OTC) 20 MG tablet Take 20 mg by mouth every 3 (three) days.   Semaglutide , 1 MG/DOSE, 4 MG/3ML SOPN Inject 1 mg as directed once a week.   [DISCONTINUED] amLODipine  (NORVASC ) 10 MG tablet Take 1 tablet (10 mg total) by mouth daily.   [DISCONTINUED] amoxicillin -clavulanate (AUGMENTIN ) 875-125 MG tablet Take 1 tablet by mouth 2 (two) times daily.   [DISCONTINUED] Evolocumab  (REPATHA ) 140 MG/ML SOSY Inject 140 mg into the skin every 14 (fourteen) days.   [DISCONTINUED] metFORMIN  (GLUCOPHAGE -XR) 500 MG 24 hr tablet Take 2 tablets (1,000 mg total) by mouth daily with breakfast.   [DISCONTINUED] predniSONE  (DELTASONE ) 20 MG tablet Take 1 tablet (20 mg total) by mouth 2 (two) times daily for 3 days.   [DISCONTINUED] Semaglutide , 1 MG/DOSE, 4 MG/3ML SOPN Inject 1 mg as directed once a week.   No facility-administered encounter medications on file as of 07/16/2024.     Lab Results  Component Value Date   WBC 7.0 07/16/2024   HGB 15.2 07/16/2024   HCT 46.2 07/16/2024   PLT 199.0 07/16/2024   GLUCOSE 93 07/16/2024   CHOL 121 07/16/2024   TRIG 121.0 07/16/2024   HDL 52.40 07/16/2024   LDLDIRECT 62.0 09/20/2021   LDLCALC 45 07/16/2024   ALT 16 07/16/2024   AST 17 07/16/2024   NA 143 07/16/2024   K 4.4 07/16/2024   CL 106 07/16/2024   CREATININE 0.95  07/16/2024   BUN 14 07/16/2024   CO2 28 07/16/2024   TSH 1.58 07/16/2024   PSA 0.38 11/10/2023   INR 0.94 02/23/2017   HGBA1C 5.8 07/16/2024   MICROALBUR 1.3 07/16/2024    CT CARDIAC SCORING Addendum Date: 06/05/2018 ADDENDUM REPORT: 06/05/2018 10:41 CLINICAL DATA:  Risk stratification EXAM: Coronary Calcium  Score TECHNIQUE: The patient was scanned on a Siemens Somatom 64 slice scanner. Axial non-contrast 3 mm slices were carried out through the heart. The data set was analyzed on a dedicated work station and scored using the Agatson method. FINDINGS: Non-cardiac: See separate report from [facility]. Ascending Aorta: Normal diameter 3.0 cm Pericardium: Normal Coronary arteries: Calcium  noted through out the LAD, D1 and proximal RCA and circumflex IMPRESSION: Coronary calcium  score of 247. This was 10 st percentile for age and sex matched control. Maude Emmer Electronically Signed   By: Maude Emmer M.D.   On: 06/05/2018 10:41   Result Date: 06/05/2018 EXAM: OVER-READ INTERPRETATION  CT CHEST The following report is an over-read performed by radiologist Dr. Franky Crease of [facility] on 06/05/2018. This over-read does not include interpretation of cardiac or coronary anatomy or pathology. The coronary calcium  score interpretation by the cardiologist is attached. COMPARISON:  None FINDINGS: Vascular: Heart is normal size.  Visualized aorta is normal caliber. Mediastinum/Nodes: No adenopathy in the lower mediastinum or hila. Lungs/Pleura: Visualized lower lungs clear.  No effusions. Upper Abdomen: Suspect mild fatty infiltration of the liver. No acute findings. Musculoskeletal: Chest wall soft tissues are unremarkable. No acute bony abnormality. IMPRESSION:  Suspect mild fatty infiltration of the liver. No acute extra cardiac abnormality. Electronically Signed: By: Franky Crease M.D. On: 06/05/2018 10:35       Assessment & Plan:  Colon cancer screening Assessment & Plan: Last  colonoscopy 2015. Due this year. Order placed for referral to GI for screening colonoscopy.   Orders: -     Ambulatory referral to Gastroenterology  Primary hypertension Assessment & Plan: Blood pressure as outlined. Continue amlodipine . Follow pressures. Follow metabolic panel.    Mixed hyperlipidemia Assessment & Plan: Continue repatha . Low cholesterol diet and exercise. Check lipid panel.   Orders: -     CBC with Differential/Platelet -     Hepatic function panel -     Lipid panel -     TSH  Controlled type 2 diabetes mellitus with other circulatory complication, without long-term current use of insulin  (HCC) Assessment & Plan: Continue ozempic  and metformin . Check A1c. Continue low carb diet and exercise.   Orders: -     Basic metabolic panel with GFR -     Hemoglobin A1c -     Microalbumin / creatinine urine ratio  Aortic atherosclerosis (HCC) Assessment & Plan: Continue repatha  and risk factor modification.    Other orders -     amLODIPine  Besylate; Take 1 tablet (10 mg total) by mouth daily.  Dispense: 90 tablet; Refill: 3 -     Repatha ; Inject 140 mg into the skin every 14 (fourteen) days.  Dispense: 4 mL; Refill: 0 -     metFORMIN  HCl ER; Take 2 tablets (1,000 mg total) by mouth daily with breakfast.  Dispense: 180 tablet; Refill: 3 -     Semaglutide  (1 MG/DOSE); Inject 1 mg as directed once a week.  Dispense: 9 mL; Refill: 0     Allena Hamilton, MD

## 2024-07-22 ENCOUNTER — Encounter: Payer: Self-pay | Admitting: Internal Medicine

## 2024-07-22 DIAGNOSIS — Z1211 Encounter for screening for malignant neoplasm of colon: Secondary | ICD-10-CM | POA: Insufficient documentation

## 2024-07-22 NOTE — Assessment & Plan Note (Signed)
 Continue repatha  and risk factor modification.

## 2024-07-22 NOTE — Assessment & Plan Note (Signed)
 Blood pressure as outlined.  Continue amlodipine.  Follow pressures.  Follow metabolic panel.

## 2024-07-22 NOTE — Assessment & Plan Note (Signed)
 Continue repatha . Low cholesterol diet and exercise. Check lipid panel.

## 2024-07-22 NOTE — Assessment & Plan Note (Signed)
 Last colonoscopy 2015. Due this year. Order placed for referral to GI for screening colonoscopy.

## 2024-07-22 NOTE — Assessment & Plan Note (Signed)
 Continue ozempic  and metformin . Check A1c. Continue low carb diet and exercise.

## 2024-08-30 ENCOUNTER — Other Ambulatory Visit (HOSPITAL_COMMUNITY): Payer: Self-pay

## 2024-08-30 ENCOUNTER — Telehealth: Payer: Self-pay

## 2024-08-30 NOTE — Telephone Encounter (Signed)
 Pharmacy Patient Advocate Encounter   Received notification from Onbase that prior authorization for Ozempic  (1 MG/DOSE) 4MG /3ML pen-injectors  is due for renewal.   Insurance verification completed.   The patient is insured through [employer].  Action: PA required; PA submitted to above mentioned insurance via Latent Key/confirmation #/EOC [id number] Status is pending

## 2024-09-03 ENCOUNTER — Other Ambulatory Visit (HOSPITAL_COMMUNITY): Payer: Self-pay

## 2024-09-03 NOTE — Telephone Encounter (Signed)
 Pharmacy Patient Advocate Encounter  Received notification from [facility] that Prior Authorization for Ozempic  (1 MG/DOSE) 4MG /3ML pen-injectors  has been APPROVED from 08/30/24 to 09/03/25. Unable to obtain price due to refill too soon rejection, last fill date 08/15/24 next available fill date12/3/25   PA #/Case ID/Reference #: [id number]

## 2024-11-05 ENCOUNTER — Other Ambulatory Visit: Payer: Self-pay | Admitting: Internal Medicine

## 2025-01-16 ENCOUNTER — Encounter: Admitting: Internal Medicine
